# Patient Record
Sex: Female | Born: 1995 | Hispanic: No | Marital: Single | Smoking: Never smoker
Health system: Southern US, Community
[De-identification: ages and names within clinical notes are randomized; demographics above are authoritative.]

## PROBLEM LIST (undated history)

## (undated) HISTORY — PX: WISDOM TOOTH EXTRACTION: SHX21

---

## 2019-06-05 ENCOUNTER — Other Ambulatory Visit: Payer: Self-pay | Admitting: *Deleted

## 2019-06-05 DIAGNOSIS — Z20822 Contact with and (suspected) exposure to covid-19: Secondary | ICD-10-CM

## 2019-06-05 NOTE — Progress Notes (Signed)
[id number] 

## 2019-06-09 LAB — NOVEL CORONAVIRUS, NAA: SARS-CoV-2, NAA: NOT DETECTED

## 2019-09-28 ENCOUNTER — Other Ambulatory Visit: Payer: Self-pay

## 2019-09-28 ENCOUNTER — Encounter (HOSPITAL_COMMUNITY): Payer: Self-pay | Admitting: Emergency Medicine

## 2019-09-28 ENCOUNTER — Ambulatory Visit (HOSPITAL_COMMUNITY)
Admission: EM | Admit: 2019-09-28 | Discharge: 2019-09-28 | Disposition: A | Discharge status: Home / Self Care | Payer: BC Managed Care – PPO

## 2019-09-28 DIAGNOSIS — U071 COVID-19: Secondary | ICD-10-CM

## 2019-09-28 MED ORDER — ACETAMINOPHEN 325 MG PO TABS
650.0000 mg | ORAL_TABLET | Freq: Once | ORAL | Status: AC
  Administered 2019-09-28: 14:00:00 650 mg via ORAL

## 2019-09-28 MED ORDER — ACETAMINOPHEN 160 MG/5ML PO SOLN
ORAL | Status: AC
  Filled 2019-09-28: qty 20.3

## 2019-09-28 NOTE — ED Provider Notes (Signed)
[place]    CSN: [id number] Arrival date & time: 09/28/19  1302      History   Chief Complaint Chief Complaint  Patient presents with  . Appointment    1:10 pm  . Fever    HPI Ruth Martinez is a 23 y.o. female.   Patient presents with 2-day history of fever.  [facility] and diagnosed with COVID.  She had a chest x-ray done and was told she has pneumonia.  She was started on Augmentin, Mucinex, albuterol inhaler.  She reports noticing a smell of ammonia over the last day.  She also reports nonproductive cough and exertional shortness of breath at times.  She denies vomiting or diarrhea.  She has been treating her fever at home with Tylenol but has not taken any since early this morning.  The history is provided by the patient.    History reviewed. No pertinent past medical history.  There are no active problems to display for this patient.   History reviewed. No pertinent surgical history.  OB History   No obstetric history on file.      Home Medications    Prior to Admission medications   Medication Sig Start Date End Date Taking? Authorizing Provider  acetaminophen (TYLENOL) 160 MG/5ML elixir Take 15 mg/kg by mouth every 4 (four) hours as needed for fever.   Yes [provider]  ALBUTEROL IN Inhale into the lungs.   Yes [provider]  amoxicillin-clavulanate (AUGMENTIN) 875-125 MG tablet Take 1 tablet by mouth 2 (two) times daily.   Yes [provider]  guaiFENesin (MUCINEX) 600 MG 12 hr tablet Take by mouth 2 (two) times daily.   Yes [provider]  Multiple Vitamins-Minerals (ZINC PO) Take by mouth.   Yes [provider]    Family History Family History  Problem Relation Age of Onset  . Diabetes Mother     Social History Social History   Tobacco Use  . Smoking status: Never Smoker  Substance Use Topics  . Alcohol use: Yes  . Drug use: Never     Allergies    Patient has no known allergies.   Review of Systems Review of Systems  Constitutional: Positive for fever. Negative for chills.  HENT: Negative for ear pain and sore throat.   Eyes: Negative for pain and visual disturbance.  Respiratory: Positive for cough and shortness of breath.   Cardiovascular: Negative for chest pain and palpitations.  Gastrointestinal: Negative for abdominal pain, diarrhea, nausea and vomiting.  Genitourinary: Negative for dysuria and hematuria.  Musculoskeletal: Negative for arthralgias and back pain.  Skin: Negative for color change and rash.  Neurological: Negative for seizures and syncope.  All other systems reviewed and are negative.    Physical Exam Triage Vital Signs ED Triage Vitals  Enc Vitals Group     BP      Pulse      Resp      Temp      Temp src      SpO2      Weight      Height      Head Circumference      Peak Flow      Pain Score      Pain Loc      Pain Edu?      Excl. in [place]?    No data found.  Updated Vital Signs BP (!) 141/90 (BP Location: Left Arm)  Comment (BP Location): regular on forearm  Pulse (!) 118   Temp (!) 101.3 F (38.5 C) (Oral)   Resp (!) 24   LMP 09/12/2019   SpO2 98%   Visual Acuity Right Eye Distance:   Left Eye Distance:   Bilateral Distance:    Right Eye Near:   Left Eye Near:    Bilateral Near:     Physical Exam Vitals signs and nursing note reviewed.  Constitutional:      General: She is not in acute distress.    Appearance: She is well-developed. She is obese. She is not ill-appearing.  HENT:     Head: Normocephalic and atraumatic.     Right Ear: Tympanic membrane normal.     Left Ear: Tympanic membrane normal.     Nose: Nose normal.     Mouth/Throat:     Mouth: Mucous membranes are moist.     Pharynx: Oropharynx is clear.  Eyes:     Conjunctiva/sclera: Conjunctivae normal.  Neck:     Musculoskeletal: Neck supple.  Cardiovascular:     Rate and Rhythm: Normal rate and regular  rhythm.     Heart sounds: No murmur.  Pulmonary:     Effort: Pulmonary effort is normal. No respiratory distress.     Breath sounds: Normal breath sounds.  Abdominal:     General: Bowel sounds are normal.     Palpations: Abdomen is soft.     Tenderness: There is no abdominal tenderness. There is no guarding or rebound.  Skin:    General: Skin is warm and dry.     Findings: No rash.  Neurological:     General: No focal deficit present.     Mental Status: She is alert and oriented to person, place, and time.      UC Treatments / Results  Labs (all labs ordered are listed, but only abnormal results are displayed) Labs Reviewed - No data to display  EKG   Radiology No results found.  Procedures Procedures (including critical care time)  Medications Ordered in UC Medications  acetaminophen (TYLENOL) tablet 650 mg (650 mg Oral Given by Other 09/28/19 1408)  acetaminophen (TYLENOL) 160 MG/5ML solution (has no administration in time range)    Initial Impression / Assessment and Plan / UC Course  I have reviewed the triage vital signs and the nursing notes.  Pertinent labs & imaging results that were available during my care of the patient were reviewed by me and considered in my medical decision making (see chart for details).    COVID-19.  Instructed patient to take Tylenol as needed for fever and discomfort.  Instructed her to continue the albuterol, Augmentin, Mucinex.  Instructed her to go to the emergency department if she has acute worsening symptoms such as high fever, shortness of breath, severe vomiting or diarrhea, or other concerning symptoms.  Patient agrees to plan of care.     Final Clinical Impressions(s) / UC Diagnoses   Final diagnoses:  COVID-19     Discharge Instructions     Take Tylenol as needed for fever and discomfort.  Continue to use the albuterol inhaler.  Continue to take the antibiotic Augmentin and the Mucinex.    Go to the emergency  department if you have acute worsening symptoms, such as high fever, shortness of breath, severe vomiting or diarrhea.        ED Prescriptions    None     PDMP not reviewed this encounter.   Wendee Beaversate, Tavi Hoogendoorn  H, NP 09/28/19 1427

## 2019-09-28 NOTE — ED Triage Notes (Signed)
Patient has complaints of fever.  [facility].  Patient reports she was told she is covid positive and has pneumonia, started patient on mucinex, albuterol and amoxicillin.    Patient complains of a constant sense or detectable odor of "amonia" smell with any inhalation  Patient did 2 e-visits with insurance carrier and they are questioning why patient is on amoxicillin with covid and pneumonia.    Patient feels about the same, headache slightly better.

## 2019-09-28 NOTE — Discharge Instructions (Addendum)
Take Tylenol as needed for fever and discomfort.  Continue to use the albuterol inhaler.  Continue to take the antibiotic Augmentin and the Mucinex.    Go to the emergency department if you have acute worsening symptoms, such as high fever, shortness of breath, severe vomiting or diarrhea.

## 2019-10-04 ENCOUNTER — Emergency Department (HOSPITAL_COMMUNITY): Payer: BC Managed Care – PPO

## 2019-10-04 ENCOUNTER — Encounter (HOSPITAL_COMMUNITY): Payer: Self-pay

## 2019-10-04 ENCOUNTER — Other Ambulatory Visit: Payer: Self-pay

## 2019-10-04 ENCOUNTER — Inpatient Hospital Stay (HOSPITAL_COMMUNITY)
Admission: EM | Admit: 2019-10-04 | Discharge: 2019-10-09 | DRG: 871 | Disposition: A | Discharge status: Home / Self Care | Payer: BC Managed Care – PPO | Attending: Internal Medicine | Admitting: Internal Medicine

## 2019-10-04 DIAGNOSIS — Z6841 Body Mass Index (BMI) 40.0 and over, adult: Secondary | ICD-10-CM

## 2019-10-04 DIAGNOSIS — U071 COVID-19: Secondary | ICD-10-CM | POA: Diagnosis present

## 2019-10-04 DIAGNOSIS — A419 Sepsis, unspecified organism: Secondary | ICD-10-CM | POA: Diagnosis not present

## 2019-10-04 DIAGNOSIS — J1289 Other viral pneumonia: Secondary | ICD-10-CM | POA: Diagnosis present

## 2019-10-04 DIAGNOSIS — Z23 Encounter for immunization: Secondary | ICD-10-CM | POA: Diagnosis not present

## 2019-10-04 DIAGNOSIS — J9601 Acute respiratory failure with hypoxia: Secondary | ICD-10-CM | POA: Diagnosis present

## 2019-10-04 DIAGNOSIS — R0602 Shortness of breath: Secondary | ICD-10-CM

## 2019-10-04 DIAGNOSIS — J129 Viral pneumonia, unspecified: Secondary | ICD-10-CM | POA: Diagnosis not present

## 2019-10-04 DIAGNOSIS — K5289 Other specified noninfective gastroenteritis and colitis: Secondary | ICD-10-CM | POA: Diagnosis present

## 2019-10-04 DIAGNOSIS — R0902 Hypoxemia: Secondary | ICD-10-CM | POA: Diagnosis present

## 2019-10-04 DIAGNOSIS — A4189 Other specified sepsis: Principal | ICD-10-CM | POA: Diagnosis present

## 2019-10-04 LAB — C-REACTIVE PROTEIN: CRP: 16.1 mg/dL — ABNORMAL HIGH (ref <1.0)

## 2019-10-04 LAB — LACTIC ACID, PLASMA: Lactic Acid, Venous: 1.3 mmol/L (ref 0.5–1.9)

## 2019-10-04 LAB — CBC WITH DIFFERENTIAL/PLATELET
Abs Immature Granulocytes: 0.04 10*3/uL (ref 0.00–0.07)
Basophils Absolute: 0 10*3/uL (ref 0.0–0.1)
Basophils Relative: 0 %
Eosinophils Absolute: 0 10*3/uL (ref 0.0–0.5)
Eosinophils Relative: 0 %
HCT: 39.5 % (ref 36.0–46.0)
Hemoglobin: 12.4 g/dL (ref 12.0–15.0)
Immature Granulocytes: 1 %
Lymphocytes Relative: 20 %
Lymphs Abs: 1.4 10*3/uL (ref 0.7–4.0)
MCH: 29.7 pg (ref 26.0–34.0)
MCHC: 31.4 g/dL (ref 30.0–36.0)
MCV: 94.5 fL (ref 80.0–100.0)
Monocytes Absolute: 0.9 10*3/uL (ref 0.1–1.0)
Monocytes Relative: 13 %
Neutro Abs: 4.6 10*3/uL (ref 1.7–7.7)
Neutrophils Relative %: 66 %
Platelets: 391 10*3/uL (ref 150–400)
RBC: 4.18 MIL/uL (ref 3.87–5.11)
RDW: 12.9 % (ref 11.5–15.5)
WBC: 7.1 10*3/uL (ref 4.0–10.5)
nRBC: 0 % (ref 0.0–0.2)

## 2019-10-04 LAB — URINALYSIS, ROUTINE W REFLEX MICROSCOPIC
Bilirubin Urine: NEGATIVE
Glucose, UA: NEGATIVE mg/dL
Ketones, ur: NEGATIVE mg/dL
Nitrite: NEGATIVE
Protein, ur: 100 mg/dL — AB
RBC / HPF: >50 RBC/hpf — ABNORMAL HIGH (ref 0–5)
Specific Gravity, Urine: 1.027 (ref 1.005–1.030)
pH: 6 (ref 5.0–8.0)

## 2019-10-04 LAB — COMPREHENSIVE METABOLIC PANEL
ALT: 23 U/L (ref 0–44)
AST: 29 U/L (ref 15–41)
Albumin: 3 g/dL — ABNORMAL LOW (ref 3.5–5.0)
Alkaline Phosphatase: 38 U/L (ref 38–126)
Anion gap: 13 (ref 5–15)
BUN: 7 mg/dL (ref 6–20)
CO2: 24 mmol/L (ref 22–32)
Calcium: 8.3 mg/dL — ABNORMAL LOW (ref 8.9–10.3)
Chloride: 98 mmol/L (ref 98–111)
Creatinine, Ser: 0.77 mg/dL (ref 0.44–1.00)
GFR calc Af Amer: >60 mL/min (ref >60)
GFR calc non Af Amer: >60 mL/min (ref >60)
Glucose, Bld: 109 mg/dL — ABNORMAL HIGH (ref 70–99)
Potassium: 3.2 mmol/L — ABNORMAL LOW (ref 3.5–5.1)
Sodium: 135 mmol/L (ref 135–145)
Total Bilirubin: 0.5 mg/dL (ref 0.3–1.2)
Total Protein: 7.9 g/dL (ref 6.5–8.1)

## 2019-10-04 LAB — I-STAT BETA HCG BLOOD, ED (MC, WL, AP ONLY): I-stat hCG, quantitative: <5 m[IU]/mL (ref <5)

## 2019-10-04 LAB — PROCALCITONIN: Procalcitonin: <0.1 ng/mL

## 2019-10-04 LAB — LACTATE DEHYDROGENASE: LDH: 293 U/L — ABNORMAL HIGH (ref 98–192)

## 2019-10-04 LAB — FERRITIN: Ferritin: 268 ng/mL (ref 11–307)

## 2019-10-04 LAB — D-DIMER, QUANTITATIVE: D-Dimer, Quant: 0.45 ug/mL-FEU (ref 0.00–0.50)

## 2019-10-04 LAB — FIBRINOGEN: Fibrinogen: 615 mg/dL — ABNORMAL HIGH (ref 210–475)

## 2019-10-04 LAB — TRIGLYCERIDES: Triglycerides: 97 mg/dL (ref <150)

## 2019-10-04 MED ORDER — ACETAMINOPHEN 650 MG RE SUPP: 650.0000 mg | Freq: Four times a day (QID) | RECTAL | Status: DC | PRN

## 2019-10-04 MED ORDER — SODIUM CHLORIDE 0.9 % IV SOLN
500.0000 mg | INTRAVENOUS | Status: DC
  Administered 2019-10-04: 500 mg via INTRAVENOUS
  Filled 2019-10-04: qty 500

## 2019-10-04 MED ORDER — DEXAMETHASONE SODIUM PHOSPHATE 10 MG/ML IJ SOLN
6.0000 mg | INTRAMUSCULAR | Status: DC
  Administered 2019-10-05 – 2019-10-07 (×4): 6 mg via INTRAVENOUS
  Filled 2019-10-04 (×4): qty 1

## 2019-10-04 MED ORDER — ONDANSETRON HCL 4 MG/2ML IJ SOLN: 4.0000 mg | Freq: Four times a day (QID) | INTRAMUSCULAR | Status: DC | PRN

## 2019-10-04 MED ORDER — ALBUTEROL SULFATE HFA 108 (90 BASE) MCG/ACT IN AERS
4.0000 | INHALATION_SPRAY | Freq: Once | RESPIRATORY_TRACT | Status: AC
  Administered 2019-10-04: 4 via RESPIRATORY_TRACT
  Filled 2019-10-04: qty 6.7

## 2019-10-04 MED ORDER — SODIUM CHLORIDE 0.9 % IV SOLN
2.0000 g | INTRAVENOUS | Status: DC
  Administered 2019-10-04: 2 g via INTRAVENOUS
  Filled 2019-10-04: qty 20

## 2019-10-04 MED ORDER — ENOXAPARIN SODIUM 40 MG/0.4ML ~~LOC~~ SOLN
40.0000 mg | SUBCUTANEOUS | Status: DC
  Administered 2019-10-05: 40 mg via SUBCUTANEOUS
  Filled 2019-10-04: qty 0.4

## 2019-10-04 MED ORDER — ACETAMINOPHEN 325 MG PO TABS: 650.0000 mg | ORAL_TABLET | Freq: Four times a day (QID) | ORAL | Status: DC | PRN

## 2019-10-04 MED ORDER — ALBUTEROL SULFATE HFA 108 (90 BASE) MCG/ACT IN AERS
2.0000 | INHALATION_SPRAY | RESPIRATORY_TRACT | Status: DC | PRN
  Filled 2019-10-04: qty 6.7

## 2019-10-04 MED ORDER — ONDANSETRON HCL 4 MG PO TABS: 4.0000 mg | ORAL_TABLET | Freq: Four times a day (QID) | ORAL | Status: DC | PRN

## 2019-10-04 MED ORDER — LACTATED RINGERS IV SOLN
INTRAVENOUS | Status: DC
  Administered 2019-10-05 (×2): via INTRAVENOUS

## 2019-10-04 MED ORDER — SODIUM CHLORIDE 0.9% FLUSH: 3.0000 mL | Freq: Once | INTRAVENOUS | Status: DC

## 2019-10-04 MED ORDER — ACETAMINOPHEN 500 MG PO TABS
1000.0000 mg | ORAL_TABLET | Freq: Once | ORAL | Status: AC
  Administered 2019-10-04: 1000 mg via ORAL
  Filled 2019-10-04: qty 2

## 2019-10-04 NOTE — H&P (Signed)
History and Physical    Ruth Martinez HGD:[id number] DOB: 04-01-1996 DOA: 10/04/2019  PCP: System, Provider Not In  Patient coming from: Home.  Chief Complaint: Shortness of breath.  HPI: Ruth Martinez is a 23 y.o. female with no significant past medical history presents to the ER with complaint of worsening shortness of breath and diarrhea.  Patient states around 2 weeks ago had gone to [place] to admit waiting 2 days later she returned back to [place] and started having headache and fever chills.  On November 5 she went to urgent care and was diagnosed with Covid 19.  Patient was advised to be in quarantine and was symptomatically treated.  Following which patient has gradually become more short of breath with worsening nonproductive cough and worsening diarrhea.  Denies vomiting.  Feeling fatigued.  Given the symptoms patient presents to the ER.  ED Course: In the ER patient is mildly hypoxic with chest x-ray showing bilateral infiltrates tachycardic lab work show creatinine 1.7 LDH 293 CRP 16.1 procalcitonin less than 0.1 lactic acid 1.3 WBC count 7.1 hemoglobin 12.4 platelets 391 EKG shows sinus tachycardia UA shows WBC 21-50 more than 50 RBCs COVID-19 test OU is pending and we do not have a documented COVID-19 test yet.  Given the hypoxia and shortness of breath I started patient on Decadron admitted for further management of acute respiratory failure likely from Covid pneumonia but we need to have confirmed test.  Patient is also empirically started on antibiotics for community-acquired pneumonia.  Review of Systems: As per HPI, rest all negative.   History reviewed. No pertinent past medical history.  Past Surgical History:  Procedure Laterality Date  . WISDOM TOOTH EXTRACTION       reports that she has never smoked. She has never used smokeless tobacco. She reports current alcohol use. She reports that she does not use drugs.  No Known Allergies  Family History   Problem Relation Age of Onset  . Diabetes Mother     Prior to Admission medications   Medication Sig Start Date End Date Taking? Authorizing Provider  albuterol (VENTOLIN HFA) 108 (90 Base) MCG/ACT inhaler Inhale 2 puffs into the lungs every 4 (four) hours as needed for wheezing or shortness of breath.  09/26/19  Yes [provider]  guaiFENesin (MUCINEX) 600 MG 12 hr tablet Take 600 mg by mouth 2 (two) times daily.    Yes [provider]    Physical Exam: Constitutional: Moderately built and nourished. Vitals:   10/04/19 2102  BP: (!) 178/76  Pulse: (!) 117  Resp: (!) 22  Temp: (!) 101.3 F (38.5 C)  TempSrc: Oral  SpO2: 93%   Eyes: Anicteric no pallor. ENMT: No discharge from the ears eyes nose or mouth. Neck: No mass felt.  No neck rigidity. Respiratory: No rhonchi or crepitations. Cardiovascular: S1-S2 heard. Abdomen: Soft nontender bowel sounds present. Musculoskeletal: No edema. Skin: No rash. Neurologic: Alert awake oriented to time place and person.  Moves all extremities. Psychiatric: Appears normal per normal affect.   Labs on Admission: I have personally reviewed following labs and imaging studies  CBC: Recent Labs  Lab 10/04/19 2125  WBC 7.1  NEUTROABS 4.6  HGB 12.4  HCT 39.5  MCV 94.5  PLT 391   Basic Metabolic Panel: Recent Labs  Lab 10/04/19 2125  NA 135  K 3.2*  CL 98  CO2 24  GLUCOSE 109*  BUN 7  CREATININE 0.77  CALCIUM 8.3*   GFR: CrCl cannot be  calculated (Unknown ideal weight.). Liver Function Tests: Recent Labs  Lab 10/04/19 2125  AST 29  ALT 23  ALKPHOS 38  BILITOT 0.5  PROT 7.9  ALBUMIN 3.0*   No results for input(s): LIPASE, AMYLASE in the last 168 hours. No results for input(s): AMMONIA in the last 168 hours. Coagulation Profile: No results for input(s): INR, PROTIME in the last 168 hours. Cardiac Enzymes: No results for input(s): CKTOTAL, CKMB, CKMBINDEX, TROPONINI in the last 168 hours. BNP  (last 3 results) No results for input(s): PROBNP in the last 8760 hours. HbA1C: No results for input(s): HGBA1C in the last 72 hours. CBG: No results for input(s): GLUCAP in the last 168 hours. Lipid Profile: Recent Labs    10/04/19 2158  TRIG 97   Thyroid Function Tests: No results for input(s): TSH, T4TOTAL, FREET4, T3FREE, THYROIDAB in the last 72 hours. Anemia Panel: Recent Labs    10/04/19 2158  FERRITIN 268   Urine analysis:    Component Value Date/Time   COLORURINE YELLOW 10/04/2019 2223   APPEARANCEUR HAZY (A) 10/04/2019 2223   LABSPEC 1.027 10/04/2019 2223   PHURINE 6.0 10/04/2019 2223   GLUCOSEU NEGATIVE 10/04/2019 2223   HGBUR LARGE (A) 10/04/2019 2223   BILIRUBINUR NEGATIVE 10/04/2019 2223   KETONESUR NEGATIVE 10/04/2019 2223   PROTEINUR 100 (A) 10/04/2019 2223   NITRITE NEGATIVE 10/04/2019 2223   LEUKOCYTESUR SMALL (A) 10/04/2019 2223   Sepsis Labs: @LABRCNTIP (procalcitonin:4,lacticidven:4) )No results found for this or any previous visit (from the past 240 hour(s)).   Radiological Exams on Admission: Dg Chest Portable 1 View  Result Date: 10/04/2019 CLINICAL DATA:  COVID, shortness of breath EXAM: PORTABLE CHEST 1 VIEW COMPARISON:  None. FINDINGS: Heart is normal size. Bilateral hazy airspace opacities in the mid and lower lungs, left greater than right. Low lung volumes. No effusions. No acute bony abnormality. IMPRESSION: Patchy bilateral airspace opacities, left greater than right. Low lung volumes. Electronically Signed   By: Rolm Baptise M.D.   On: 10/04/2019 22:26    EKG: Independently reviewed.  Sinus tachycardia.  Assessment/Plan Principal Problem:   Acute respiratory failure with hypoxemia (HCC) Active Problems:   Viral pneumonia   Acute respiratory failure with hypoxia (HCC)    1. Acute respiratory failure with hypoxia and SIRS picture likely from Covid pneumonia.  COVID-19 test is pending.  Patient states she was confirmed COVID-19 on  September 26, 2019 results of which we do not have access to.  For now I have kept patient on Decadron 6 mg IV daily and antibiotics for community-acquired pneumonia if COVID-19 is positive can discontinue antibiotics.  If COVID-19 does come back positive we will need to start patient on remdesivir. 2. Readings of elevated blood pressure -patient states she does not have hypertension.  Closely follow blood pressure trends. 3. Diarrhea likely from COVID-19 infection.  Given the patient has SIRS and respiratory failure symptoms will need more than 2 midnight stay in inpatient status.   DVT prophylaxis: Lovenox. Code Status: Full code. Family Communication: Discussed with patient. Disposition Plan: Home. Consults called: None. Admission status: Inpatient.   Rise Patience MD [facility] Pager [phone].  If 7PM-7AM, please contact night-coverage [email or web address] Password [email or web address]  10/04/2019, 11:50 PM

## 2019-10-04 NOTE — ED Provider Notes (Signed)
Emergency Department Provider Note   I have reviewed the triage vital signs and the nursing notes.   HISTORY  Chief Complaint COVID   HPI Ruth Martinez is a 23 y.o. female presents emergency department with worsening shortness of breath and fever in the setting of testing positive for COVID-19 on 11/5.  Patient states initially she began having respiratory symptoms after returning home from a wedding in [place].  She was started on Augmentin and her COVID-19 test came back positive.  She has been managing with supportive care at home but over the past several days become increasingly short of breath.  She states she can now only walk approximately 10 steps before she has to stop and rest which is very unusual for her.  She is only having occasional chest pain with coughing.  Denies vomiting. Patient does describe some diarrhea. No abdominal pain.  Denies UTI symptoms.  She has had difficulty controlling her fever with Tylenol alone.  No history of asthma or other breathing problems.   History reviewed. No pertinent past medical history.  Patient Active Problem List   Diagnosis Date Noted  . Pneumonia due to COVID-19 virus 10/05/2019  . Sepsis ([place]) 10/05/2019  . Acute respiratory failure with hypoxemia ([place]) 10/04/2019  . Viral pneumonia 10/04/2019  . Acute respiratory failure with hypoxia ([place]) 10/04/2019    Past Surgical History:  Procedure Laterality Date  . WISDOM TOOTH EXTRACTION      Allergies Patient has no known allergies.  Family History  Problem Relation Age of Onset  . Diabetes Mother     Social History Social History   Tobacco Use  . Smoking status: Never Smoker  . Smokeless tobacco: Never Used  Substance Use Topics  . Alcohol use: Yes    Comment: Occasionally  . Drug use: Never    Review of Systems  Constitutional: Positive fever/chills Eyes: No visual changes. ENT: Positive congestion.  Cardiovascular: Denies chest pain. Respiratory:  Positive shortness of breath. Gastrointestinal: No abdominal pain. No nausea, no vomiting. No diarrhea.  No constipation. Genitourinary: Negative for dysuria. Musculoskeletal: Negative for back pain. Skin: Negative for rash. Neurological: Negative for headaches, focal weakness or numbness.  10-point ROS otherwise negative.  ____________________________________________   PHYSICAL EXAM:  VITAL SIGNS: ED Triage Vitals  Enc Vitals Group     BP 10/04/19 2102 (!) 178/76     Pulse Rate 10/04/19 2102 (!) 117     Resp 10/04/19 2102 (!) 22     Temp 10/04/19 2102 (!) 101.3 F (38.5 C)     Temp Source 10/04/19 2102 Oral     SpO2 10/04/19 2102 93 %     Pain Score 10/04/19 2108 10   Constitutional: Alert and oriented. Well appearing and in no acute distress. Eyes: Conjunctivae are normal. Head: Atraumatic. Nose: No congestion/rhinnorhea. Mouth/Throat: Mucous membranes are moist.   Neck: No stridor.   Cardiovascular: Tachycardia. Good peripheral circulation. Grossly normal heart sounds.   Respiratory: Increased respiratory effort.  No retractions. Lungs CTAB. Gastrointestinal: Soft and nontender. No distention.  Musculoskeletal: No lower extremity tenderness nor edema. No gross deformities of extremities. Neurologic:  Normal speech and language. No gross focal neurologic deficits are appreciated.  Skin:  Skin is warm, dry and intact. No rash noted.   ____________________________________________   LABS (all labs ordered are listed, but only abnormal results are displayed)  Labs Reviewed  SARS CORONAVIRUS 2 (TAT 6-24 HRS) - Abnormal; Notable for the following components:      Result  Value   SARS Coronavirus 2 POSITIVE (*)    All other components within normal limits  COMPREHENSIVE METABOLIC PANEL - Abnormal; Notable for the following components:   Potassium 3.2 (*)    Glucose, Bld 109 (*)    Calcium 8.3 (*)    Albumin 3.0 (*)    All other components within normal limits   URINALYSIS, ROUTINE W REFLEX MICROSCOPIC - Abnormal; Notable for the following components:   APPearance HAZY (*)    Hgb urine dipstick LARGE (*)    Protein, ur 100 (*)    Leukocytes,Ua SMALL (*)    RBC / HPF >50 (*)    Bacteria, UA RARE (*)    All other components within normal limits  LACTATE DEHYDROGENASE - Abnormal; Notable for the following components:   LDH 293 (*)    All other components within normal limits  FIBRINOGEN - Abnormal; Notable for the following components:   Fibrinogen 615 (*)    All other components within normal limits  C-REACTIVE PROTEIN - Abnormal; Notable for the following components:   CRP 16.1 (*)    All other components within normal limits  BASIC METABOLIC PANEL - Abnormal; Notable for the following components:   Glucose, Bld 165 (*)    BUN <5 (*)    Calcium 8.3 (*)    All other components within normal limits  CBC - Abnormal; Notable for the following components:   Hemoglobin 11.7 (*)    All other components within normal limits  CULTURE, BLOOD (ROUTINE X 2)  CULTURE, BLOOD (ROUTINE X 2)  LACTIC ACID, PLASMA  CBC WITH DIFFERENTIAL/PLATELET  D-DIMER, QUANTITATIVE (NOT [facility])  PROCALCITONIN  FERRITIN  TRIGLYCERIDES  CBC  CREATININE, SERUM  HIV ANTIBODY (ROUTINE TESTING W REFLEX)  I-STAT BETA HCG BLOOD, ED ([facility], [facility], [facility] ONLY)   ____________________________________________  EKG   EKG Interpretation  Date/Time:  Friday October 04 2019 21:14:37 EST Ventricular Rate:  113 PR Interval:  146 QRS Duration: 86 QT Interval:  326 QTC Calculation: 447 R Axis:   98 Text Interpretation: Sinus tachycardia Rightward axis Borderline ECG No STEMI Confirmed by Alona BeneLong, Joshua [phone]([id number]) on 10/04/2019 10:49:48 PM       ____________________________________________  RADIOLOGY  Dg Chest Portable 1 View  Result Date: 10/04/2019 CLINICAL DATA:  COVID, shortness of breath EXAM: PORTABLE CHEST 1 VIEW COMPARISON:  None. FINDINGS: Heart is normal size.  Bilateral hazy airspace opacities in the mid and lower lungs, left greater than right. Low lung volumes. No effusions. No acute bony abnormality. IMPRESSION: Patchy bilateral airspace opacities, left greater than right. Low lung volumes. Electronically Signed   By: Charlett NoseKevin  Dover M.D.   On: 10/04/2019 22:26    ____________________________________________   PROCEDURES  Procedure(s) performed:   Procedures  CRITICAL CARE Performed by: Maia PlanJoshua G Long Total critical care time: 35 minutes Critical care time was exclusive of separately billable procedures and treating other patients. Critical care was necessary to treat or prevent imminent or life-threatening deterioration. Critical care was time spent personally by me on the following activities: development of treatment plan with patient and/or surrogate as well as nursing, discussions with consultants, evaluation of patient's response to treatment, examination of patient, obtaining history from patient or surrogate, ordering and performing treatments and interventions, ordering and review of laboratory studies, ordering and review of radiographic studies, pulse oximetry and re-evaluation of patient's condition.  Alona BeneJoshua Long, MD Emergency Medicine  ____________________________________________   INITIAL IMPRESSION / ASSESSMENT AND PLAN / ED COURSE  Pertinent  labs & imaging results that were available during my care of the patient were reviewed by me and considered in my medical decision making (see chart for details).   Patient presents to the emergency department for evaluation of worsening shortness of breath and continued fever in the setting of COVID-19 diagnosis.  I do not have a positive COVID-19 test in our system.  Patient is more dyspneic than I would expect given her age and other risk factors.  She has borderline hypoxemia here with room air saturation 93% on room air in triage.  I will ambulate the patient on pulse ox for further  evaluation.  Chest x-ray and labs are pending.  I have added preadmit Covid labs as well.   CXR with atypical PNA. Labs concerning for COVID but test is pending. Covering with abx initially. Patient ambulated and became very SOB and sats dipped to 90%. Placed on 3L [place] and feeling improved.   Discussed patient's case with [facility], Dr. Toniann Fail to request admission. Patient and family (if present) updated with plan. Care transferred to [facility] service.  I reviewed all nursing notes, vitals, pertinent old records, EKGs, labs, imaging (as available).  ____________________________________________  FINAL CLINICAL IMPRESSION(S) / ED DIAGNOSES  Final diagnoses:  COVID-19  Hypoxemia  SOB (shortness of breath)     MEDICATIONS GIVEN DURING THIS VISIT:  Medications  sodium chloride flush (NS) 0.9 % injection 3 mL (3 mLs Intravenous Not Given 10/04/19 2348)  cefTRIAXone (ROCEPHIN) 2 g in sodium chloride 0.9 % 100 mL IVPB (0 g Intravenous Stopped 10/04/19 2354)  azithromycin (ZITHROMAX) 500 mg in sodium chloride 0.9 % 250 mL IVPB (0 mg Intravenous Stopped 10/05/19 0129)  albuterol (VENTOLIN HFA) 108 (90 Base) MCG/ACT inhaler 2 puff (has no administration in time range)  acetaminophen (TYLENOL) tablet 650 mg (has no administration in time range)    Or  acetaminophen (TYLENOL) suppository 650 mg (has no administration in time range)  ondansetron (ZOFRAN) tablet 4 mg (has no administration in time range)    Or  ondansetron (ZOFRAN) injection 4 mg (has no administration in time range)  enoxaparin (LOVENOX) injection 40 mg (40 mg Subcutaneous Given 10/05/19 1129)  lactated ringers infusion ( Intravenous Transfusing/Transfer 10/05/19 1145)  dexamethasone (DECADRON) injection 6 mg (6 mg Intravenous Given 10/05/19 0008)  remdesivir 100 mg in sodium chloride 0.9 % 250 mL IVPB (has no administration in time range)  remdesivir 200 mg in sodium chloride 0.9 % 250 mL IVPB (200 mg Intravenous Transfusing/Transfer  10/05/19 1145)  acetaminophen (TYLENOL) tablet 1,000 mg (1,000 mg Oral Given 10/04/19 2300)  albuterol (VENTOLIN HFA) 108 (90 Base) MCG/ACT inhaler 4 puff (4 puffs Inhalation Given 10/04/19 2300)    Note:  This document was prepared using Dragon voice recognition software and may include unintentional dictation errors.  Alona Bene, MD, [facility] Emergency Medicine    Long, Arlyss Repress, MD 10/05/19 1150

## 2019-10-04 NOTE — ED Triage Notes (Signed)
Pt states she has been COVID + for the past 9 days, seen at UC twice, SOB is getting worse and reports oxygen is dropping at home, diarrhea and fevers not controlled at home

## 2019-10-04 NOTE — ED Notes (Addendum)
Pt became severely winded walking from stretcher to door. Her O2 dropped from 97% @ baseline to 90% while ambulating. Pt with auditory wheezing and coughing upon exertion. She stated she was unable to walk more than twice from door to stretcher. Pt put on 2L nasal canula and seated in stretcher.

## 2019-10-05 ENCOUNTER — Other Ambulatory Visit: Payer: Self-pay

## 2019-10-05 DIAGNOSIS — J1289 Other viral pneumonia: Secondary | ICD-10-CM

## 2019-10-05 DIAGNOSIS — J1282 Pneumonia due to coronavirus disease 2019: Secondary | ICD-10-CM | POA: Diagnosis present

## 2019-10-05 DIAGNOSIS — A419 Sepsis, unspecified organism: Secondary | ICD-10-CM | POA: Diagnosis present

## 2019-10-05 DIAGNOSIS — U071 COVID-19: Secondary | ICD-10-CM

## 2019-10-05 LAB — CBC
HCT: 38.7 % (ref 36.0–46.0)
HCT: 36.1 % (ref 36.0–46.0)
Hemoglobin: 12.4 g/dL (ref 12.0–15.0)
Hemoglobin: 11.7 g/dL — ABNORMAL LOW (ref 12.0–15.0)
MCH: 29.8 pg (ref 26.0–34.0)
MCH: 29.9 pg (ref 26.0–34.0)
MCHC: 32 g/dL (ref 30.0–36.0)
MCHC: 32.4 g/dL (ref 30.0–36.0)
MCV: 93 fL (ref 80.0–100.0)
MCV: 92.3 fL (ref 80.0–100.0)
Platelets: 357 10*3/uL (ref 150–400)
Platelets: 362 10*3/uL (ref 150–400)
RBC: 4.16 MIL/uL (ref 3.87–5.11)
RBC: 3.91 MIL/uL (ref 3.87–5.11)
RDW: 12.8 % (ref 11.5–15.5)
RDW: 12.9 % (ref 11.5–15.5)
WBC: 7.4 10*3/uL (ref 4.0–10.5)
WBC: 6.9 10*3/uL (ref 4.0–10.5)
nRBC: 0 % (ref 0.0–0.2)
nRBC: 0 % (ref 0.0–0.2)

## 2019-10-05 LAB — CREATININE, SERUM
Creatinine, Ser: 0.7 mg/dL (ref 0.44–1.00)
GFR calc Af Amer: >60 mL/min (ref >60)
GFR calc non Af Amer: >60 mL/min (ref >60)

## 2019-10-05 LAB — HIV ANTIBODY (ROUTINE TESTING W REFLEX): HIV Screen 4th Generation wRfx: NONREACTIVE

## 2019-10-05 LAB — BASIC METABOLIC PANEL
Anion gap: 12 (ref 5–15)
BUN: <5 mg/dL — ABNORMAL LOW (ref 6–20)
CO2: 23 mmol/L (ref 22–32)
Calcium: 8.3 mg/dL — ABNORMAL LOW (ref 8.9–10.3)
Chloride: 102 mmol/L (ref 98–111)
Creatinine, Ser: 0.73 mg/dL (ref 0.44–1.00)
GFR calc Af Amer: >60 mL/min (ref >60)
GFR calc non Af Amer: >60 mL/min (ref >60)
Glucose, Bld: 165 mg/dL — ABNORMAL HIGH (ref 70–99)
Potassium: 3.5 mmol/L (ref 3.5–5.1)
Sodium: 137 mmol/L (ref 135–145)

## 2019-10-05 LAB — TYPE AND SCREEN
ABO/RH(D): B POS
Antibody Screen: NEGATIVE

## 2019-10-05 LAB — SARS CORONAVIRUS 2 (TAT 6-24 HRS): SARS Coronavirus 2: POSITIVE — AB

## 2019-10-05 MED ORDER — SODIUM CHLORIDE 0.9 % IV SOLN
200.0000 mg | Freq: Once | INTRAVENOUS | Status: AC
  Administered 2019-10-05: 200 mg via INTRAVENOUS
  Filled 2019-10-05: qty 40

## 2019-10-05 MED ORDER — LACTATED RINGERS IV SOLN: INTRAVENOUS | Status: DC

## 2019-10-05 MED ORDER — ORAL CARE MOUTH RINSE
15.0000 mL | Freq: Two times a day (BID) | OROMUCOSAL | Status: DC
  Administered 2019-10-05 – 2019-10-09 (×6): 15 mL via OROMUCOSAL

## 2019-10-05 MED ORDER — SODIUM CHLORIDE 0.9 % IV SOLN
100.0000 mg | INTRAVENOUS | Status: AC
  Administered 2019-10-06 – 2019-10-09 (×4): 100 mg via INTRAVENOUS
  Filled 2019-10-05 (×4): qty 100

## 2019-10-05 NOTE — ED Notes (Signed)
Breakfast ordered for patient  

## 2019-10-05 NOTE — ED Notes (Signed)
ED TO INPATIENT HANDOFF REPORT  ED Nurse Name and Phone #: Gracelynn Bircher, [phone]  S Name/Age/Gender Ruth Martinez 23 y.o. female Room/Bed: 016C/016C  Code Status   Code Status: Full Code  Home/SNF/Other Home Patient oriented to: self, place, time and situation Is this baseline? Yes   Triage Complete: Triage complete  Chief Complaint SOB, COVID+  Triage Note Pt states she has been COVID + for the past 9 days, seen at UC twice, SOB is getting worse and reports oxygen is dropping at home, diarrhea and fevers not controlled at home    Allergies No Known Allergies  Level of Care/Admitting Diagnosis ED Disposition    ED Disposition Condition Comment   Admit  Hospital Area: [facility] [[id number]]  Level of Care: Telemetry Medical [104]  Covid Evaluation: Person Under Investigation (PUI)  Diagnosis: Acute respiratory failure with hypoxia [facility](HCC) [[id number]]) [[id number]]  Admitting Physician: Eduard ClosKAKRAKANDY, ARSHAD N [phone]]  Attending Physician: Eduard ClosKAKRAKANDY, ARSHAD N [phone]]  Estimated length of stay: past midnight tomorrow  Certification:: I certify this patient will need inpatient services for at least 2 midnights  PT Class (Do Not Modify): Inpatient [101]  PT Acc Code (Do Not Modify): Private [1]       B Medical/Surgery History History reviewed. No pertinent past medical history. Past Surgical History:  Procedure Laterality Date  . WISDOM TOOTH EXTRACTION       A IV Location/Drains/Wounds Patient Lines/Drains/Airways Status   Active Line/Drains/Airways    Name:   Placement date:   Placement time:   Site:   Days:   Peripheral IV 10/04/19 Right Forearm   10/04/19    2317    Forearm   1   Peripheral IV 10/04/19 Right Hand   10/04/19    2321    Hand   1          Intake/Output Last 24 hours  Intake/Output Summary (Last 24 hours) at 10/05/2019 0210 Last data filed at 10/05/2019 0129 Gross per 24 hour  Intake 350 ml  Output -  Net 350 ml    Labs/Imaging Results for  orders placed or performed during the hospital encounter of 10/04/19 (from the past 48 hour(s))  Lactic acid, plasma     Status: None   Collection Time: 10/04/19  9:25 PM  Result Value Ref Range   Lactic Acid, Venous 1.3 0.5 - 1.9 mmol/L    Comment: Performed at [facility], [address] [place], [place] [address]  Comprehensive metabolic panel     Status: Abnormal   Collection Time: 10/04/19  9:25 PM  Result Value Ref Range   Sodium 135 135 - 145 mmol/L   Potassium 3.2 (L) 3.5 - 5.1 mmol/L   Chloride 98 98 - 111 mmol/L   CO2 24 22 - 32 mmol/L   Glucose, Bld 109 (H) 70 - 99 mg/dL   BUN 7 6 - 20 mg/dL   Creatinine, Ser 6.440.77 0.44 - 1.00 mg/dL   Calcium 8.3 (L) 8.9 - 10.3 mg/dL   Total Protein 7.9 6.5 - 8.1 g/dL   Albumin 3.0 (L) 3.5 - 5.0 g/dL   AST 29 15 - 41 U/L   ALT 23 0 - 44 U/L   Alkaline Phosphatase 38 38 - 126 U/L   Total Bilirubin 0.5 0.3 - 1.2 mg/dL   GFR calc non Af Amer >60 >60 mL/min   GFR calc Af Amer >60 >60 mL/min   Anion gap 13 5 - 15    Comment: Performed  at [facility], [address] [place], [place] [address]  CBC with Differential     Status: None   Collection Time: 10/04/19  9:25 PM  Result Value Ref Range   WBC 7.1 4.0 - 10.5 K/uL   RBC 4.18 3.87 - 5.11 MIL/uL   Hemoglobin 12.4 12.0 - 15.0 g/dL   HCT 98.1 19.1 - 47.8 %   MCV 94.5 80.0 - 100.0 fL   MCH 29.7 26.0 - 34.0 pg   MCHC 31.4 30.0 - 36.0 g/dL   RDW 29.5 62.1 - 30.8 %   Platelets 391 150 - 400 K/uL   nRBC 0.0 0.0 - 0.2 %   Neutrophils Relative % 66 %   Neutro Abs 4.6 1.7 - 7.7 K/uL   Lymphocytes Relative 20 %   Lymphs Abs 1.4 0.7 - 4.0 K/uL   Monocytes Relative 13 %   Monocytes Absolute 0.9 0.1 - 1.0 K/uL   Eosinophils Relative 0 %   Eosinophils Absolute 0.0 0.0 - 0.5 K/uL   Basophils Relative 0 %   Basophils Absolute 0.0 0.0 - 0.1 K/uL   Immature Granulocytes 1 %   Abs Immature Granulocytes 0.04 0.00 - 0.07 K/uL    Comment: Performed at [facility], [address], [place], [place] [address]  D-dimer, quantitative     Status: None   Collection Time: 10/04/19  9:58 PM  Result Value Ref Range   D-Dimer, Quant 0.45 0.00 - 0.50 ug/mL-FEU    Comment: (NOTE) At the manufacturer cut-off of 0.50 ug/mL FEU, this assay has been documented to exclude PE with a sensitivity and negative predictive value of 97 to 99%.  At this time, this assay has not been approved by the FDA to exclude DVT/VTE. Results should be correlated with clinical presentation. Performed at [facility], [address], [place], [place] [address]   Procalcitonin     Status: None   Collection Time: 10/04/19  9:58 PM  Result Value Ref Range   Procalcitonin <0.10 ng/mL    Comment:        Interpretation: PCT (Procalcitonin) <= 0.5 ng/mL: Systemic infection (sepsis) is not likely. Local bacterial infection is possible. (NOTE)       Sepsis PCT Algorithm           Lower Respiratory Tract                                      Infection PCT Algorithm    ----------------------------     ----------------------------         PCT < 0.25 ng/mL                PCT < 0.10 ng/mL         Strongly encourage             Strongly discourage   discontinuation of antibiotics    initiation of antibiotics    ----------------------------     -----------------------------       PCT 0.25 - 0.50 ng/mL            PCT 0.10 - 0.25 ng/mL               OR       >80% decrease in PCT            Discourage initiation of  antibiotics      Encourage discontinuation           of antibiotics    ----------------------------     -----------------------------         PCT >= 0.50 ng/mL              PCT 0.26 - 0.50 ng/mL               AND        <80% decrease in PCT             Encourage initiation of                                             antibiotics       Encourage continuation           of antibiotics    ----------------------------      -----------------------------        PCT >= 0.50 ng/mL                  PCT > 0.50 ng/mL               AND         increase in PCT                  Strongly encourage                                      initiation of antibiotics    Strongly encourage escalation           of antibiotics                                     -----------------------------                                           PCT <= 0.25 ng/mL                                                 OR                                        > 80% decrease in PCT                                     Discontinue / Do not initiate                                             antibiotics Performed at [facility], [place] [address], [place], [place] [address]   Lactate dehydrogenase     Status: Abnormal   Collection Time: 10/04/19  9:58 PM  Result Value Ref Range   LDH 293 (H) 98 - 192 U/L    Comment: Performed at [facility], [address] [place], [place] [address]  Ferritin     Status: None   Collection Time: 10/04/19  9:58 PM  Result Value Ref Range   Ferritin 268 11 - 307 ng/mL    Comment: Performed at [facility], [address] [place], [place] [address]  Triglycerides     Status: None   Collection Time: 10/04/19  9:58 PM  Result Value Ref Range   Triglycerides 97 <150 mg/dL    Comment: Performed at [facility], [address] [place], [place] [address]  Fibrinogen     Status: Abnormal   Collection Time: 10/04/19  9:58 PM  Result Value Ref Range   Fibrinogen 615 (H) 210 - 475 mg/dL    Comment: Performed at [facility], [address] [place], [place] [address]  C-reactive protein     Status: Abnormal   Collection Time: 10/04/19  9:58 PM  Result Value Ref Range   CRP 16.1 (H) <1.0 mg/dL    Comment: Performed at [facility], [address] [place], [place] [address]  I-Stat beta hCG blood, ED     Status: None   Collection Time: 10/04/19 10:01 PM  Result Value Ref Range    I-stat hCG, quantitative <5.0 <5 mIU/mL   Comment 3            Comment:   GEST. AGE      CONC.  (mIU/mL)   <=1 WEEK        5 - 50     2 WEEKS       50 - 500     3 WEEKS       100 - 10,000     4 WEEKS     1,000 - 30,000        FEMALE AND NON-PREGNANT FEMALE:     LESS THAN 5 mIU/mL   Urinalysis, Routine w reflex microscopic     Status: Abnormal   Collection Time: 10/04/19 10:23 PM  Result Value Ref Range   Color, Urine YELLOW YELLOW   APPearance HAZY (A) CLEAR   Specific Gravity, Urine 1.027 1.005 - 1.030   pH 6.0 5.0 - 8.0   Glucose, UA NEGATIVE NEGATIVE mg/dL   Hgb urine dipstick LARGE (A) NEGATIVE   Bilirubin Urine NEGATIVE NEGATIVE   Ketones, ur NEGATIVE NEGATIVE mg/dL   Protein, ur 130 (A) NEGATIVE mg/dL   Nitrite NEGATIVE NEGATIVE   Leukocytes,Ua SMALL (A) NEGATIVE   RBC / HPF >50 (H) 0 - 5 RBC/hpf   WBC, UA 21-50 0 - 5 WBC/hpf   Bacteria, UA RARE (A) NONE SEEN   Squamous Epithelial / LPF 0-5 0 - 5   Mucus PRESENT     Comment: Performed at [facility], [address] [place], [place] [address]  CBC     Status: None   Collection Time: 10/04/19 11:49 PM  Result Value Ref Range   WBC 7.4 4.0 - 10.5 K/uL   RBC 4.16 3.87 - 5.11 MIL/uL   Hemoglobin 12.4 12.0 - 15.0 g/dL   HCT 46.9 62.9 - 52.8 %   MCV 93.0 80.0 - 100.0 fL   MCH 29.8 26.0 - 34.0 pg   MCHC 32.0 30.0 - 36.0 g/dL   RDW 41.3 24.4 - 01.0 %   Platelets  357 150 - 400 K/uL   nRBC 0.0 0.0 - 0.2 %    Comment: Performed at [facility], [address] [place], [place] [address]   Dg Chest Portable 1 View  Result Date: 10/04/2019 CLINICAL DATA:  COVID, shortness of breath EXAM: PORTABLE CHEST 1 VIEW COMPARISON:  None. FINDINGS: Heart is normal size. Bilateral hazy airspace opacities in the mid and lower lungs, left greater than right. Low lung volumes. No effusions. No acute bony abnormality. IMPRESSION: Patchy bilateral airspace opacities, left greater than right. Low lung volumes. Electronically Signed    By: Charlett Nose M.D.   On: 10/04/2019 22:26    Pending Labs Unresulted Labs (From admission, onward)    Start     Ordered   10/11/19 0500  Creatinine, serum  (enoxaparin (LOVENOX)    CrCl >/= 30 ml/min)  Weekly,   R    Comments: while on enoxaparin therapy    10/04/19 2350   10/05/19 0500  Basic metabolic panel  Tomorrow morning,   R     10/04/19 2350   10/05/19 0500  CBC  Tomorrow morning,   R     10/04/19 2350   10/04/19 2349  Creatinine, serum  (enoxaparin (LOVENOX)    CrCl >/= 30 ml/min)  Once,   STAT    Comments: Baseline for enoxaparin therapy IF NOT ALREADY DRAWN.    10/04/19 2350   10/04/19 2159  SARS CORONAVIRUS 2 (TAT 6-24 HRS) Nasopharyngeal Nasopharyngeal Swab  (Novel Coronavirus, [facility] Order))  ONCE - STAT,   STAT    Question Answer Comment  Is this test for diagnosis or screening Diagnosis of ill patient   Symptomatic for COVID-19 as defined by CDC Yes   Date of Symptom Onset 09/26/2019   Hospitalized for COVID-19 Yes   Admitted to ICU for COVID-19 No   Previously tested for COVID-19 Yes   Resident in a congregate (group) care setting No   Employed in healthcare setting No   Pregnant No      10/04/19 2158   10/04/19 2158  Blood Culture (routine x 2)  BLOOD CULTURE X 2,   STAT     10/04/19 2158   10/04/19 2111  Lactic acid, plasma  Now then every 2 hours,   STAT     10/04/19 2111   10/04/19 0147  HIV Antibody (routine testing w rflx)  Once,   R     10/04/19 0147          Vitals/Pain Today's Vitals   10/04/19 2108 10/04/19 2215 10/04/19 2230 10/05/19 0200  BP:  130/79 127/88 107/64  Pulse:  100 (!) 107   Resp:  (!) 23 (!) 22 16  Temp:      TempSrc:      SpO2:  99% 99%   PainSc: 10-Worst pain ever       Isolation Precautions Airborne and Contact precautions  Medications Medications  sodium chloride flush (NS) 0.9 % injection 3 mL (3 mLs Intravenous Not Given 10/04/19 2348)  cefTRIAXone (ROCEPHIN) 2 g in sodium chloride 0.9 % 100 mL IVPB  (0 g Intravenous Stopped 10/04/19 2354)  azithromycin (ZITHROMAX) 500 mg in sodium chloride 0.9 % 250 mL IVPB (0 mg Intravenous Stopped 10/05/19 0129)  albuterol (VENTOLIN HFA) 108 (90 Base) MCG/ACT inhaler 2 puff (has no administration in time range)  acetaminophen (TYLENOL) tablet 650 mg (has no administration in time range)    Or  acetaminophen (TYLENOL) suppository 650 mg (has no  administration in time range)  ondansetron (ZOFRAN) tablet 4 mg (has no administration in time range)    Or  ondansetron (ZOFRAN) injection 4 mg (has no administration in time range)  enoxaparin (LOVENOX) injection 40 mg (has no administration in time range)  lactated ringers infusion ( Intravenous New Bag/Given 10/05/19 0028)  dexamethasone (DECADRON) injection 6 mg (6 mg Intravenous Given 10/05/19 0008)  acetaminophen (TYLENOL) tablet 1,000 mg (1,000 mg Oral Given 10/04/19 2300)  albuterol (VENTOLIN HFA) 108 (90 Base) MCG/ACT inhaler 4 puff (4 puffs Inhalation Given 10/04/19 2300)    Mobility walks Low fall risk   Focused Assessments Pulmonary Assessment Handoff:  Lung sounds:   O2 Device: Room Air        R Recommendations: See Admitting Provider Note  Report given to:   Additional Notes:

## 2019-10-05 NOTE — Progress Notes (Signed)
[facility]  PROGRESS NOTE  Ruth MuscaRachel Martinez WUJ:[id number] DOB: 11/01/1996 DOA: 10/04/2019 PCP: System, Provider Not In Admit date - 10/04/2019   Admitting Physician No admitting provider for patient encounter.  Outpatient Primary MD for the patient is System, Provider Not In  LOS - 1 Brief Narrative   Ruth Martinez is a 23 y.o. year old female with no significant medical history who presented on 10/04/2019 with worsening SOB and diarrhea, headache, fever and chills for several days. Recently diagnosed at urgent care on 11/5 with COVID 19 for which she self quarantined and was treated supportively with Augmentin, albuterol as needed inhaler, Mucinex  but had worsening SOB with non-productive cough prompting [facility].  Of note, patient reports a family sick contact being [place].  She knows several people from that wedding who have also contracted COVID-19.  In the ED found to have T-max of 101.3, tachypneic, tachycardic, chest x-ray showed patchy bilateral airspace opacities.  Patient was started empirically on azithromycin and ceftriaxone for CAP coverage and decadron due to hypoxia and SOB while awaiting Covid test which returned positive overnight.  Labwork notable for CRP 16.1, ferritin 268, D-dimer 0.45, procalcitonin < 0.1.   Subjective  FNAME@ [facility] today has no chest pain.  Does have mild cough, feels breathing better on oxygen  A & P    1. Acute hypoxic respiratory failure secondary to Covid pneumonia.  Given failed outpatient supportive care, worsening shortness of breath and hypoxia, will start remdesivir, continue IV Decadron started on admission.  Doubt bacterial pneumonia given chest x-ray imaging more consistent with viral infection and likely source being Covid and unremarkable procalcitonin, monitor blood cultures.  Monitor serial inflammatory markers.  [facility]   2. Sepsis without acute organ dysfunction secondary to Covid-19 pneumonia.  Presented with fever, tachycardia, tachypnea with  pneumonia, now known to be COVID-19 positive. But maintaining normal blood pressures with no acute organ dysfunction.  Continue treatment as above, currently hemodynamically stable     Family Communication  : None at bedside  Code Status : Full code  Disposition Plan  : Will need continued inpatient stay for IV remdesivir, IV Decadron for Covid pneumonia, monitoring hemodynamics  Consults  : None  Procedures  : None  DVT Prophylaxis  :  Lovenox   Lab Results  Component Value Date   PLT 362 10/05/2019    Diet :  Diet Order            Diet regular Room service appropriate? Yes; Fluid consistency: Thin  Diet effective now               Inpatient Medications Scheduled Meds: . dexamethasone (DECADRON) injection  6 mg Intravenous Q24H  . enoxaparin (LOVENOX) injection  40 mg Subcutaneous Q24H  . sodium chloride flush  3 mL Intravenous Once   Continuous Infusions: . azithromycin Stopped (10/05/19 0129)  . cefTRIAXone (ROCEPHIN)  IV Stopped (10/04/19 2354)  . lactated ringers 100 mL/hr at 10/05/19 0028   PRN Meds:.acetaminophen **OR** acetaminophen, albuterol, ondansetron **OR** ondansetron (ZOFRAN) IV  Antibiotics  :   Anti-infectives (From admission, onward)   Start     Dose/Rate Route Frequency Ordered Stop   10/04/19 2245  cefTRIAXone (ROCEPHIN) 2 g in sodium chloride 0.9 % 100 mL IVPB     2 g 200 mL/hr over 30 Minutes Intravenous Every 24 hours 10/04/19 2231     10/04/19 2245  azithromycin (  ZITHROMAX) 500 mg in sodium chloride 0.9 % 250 mL IVPB     500 mg 250 mL/hr over 60 Minutes Intravenous Every 24 hours 10/04/19 2231         Objective   Vitals:   10/05/19 0430 10/05/19 0500 10/05/19 0530 10/05/19 0545  BP: 110/71 121/75 115/82 122/80  Pulse: 86 81 82 82  Resp: (!) 24 18 19  (!) 29  Temp:      TempSrc:      SpO2: 95% 98% 96%  99%    SpO2: 99 %  Wt Readings from Last 3 Encounters:  No data found for Wt     Intake/Output Summary (Last 24 hours) at 10/05/2019 0813 Last data filed at 10/05/2019 0129 Gross per 24 hour  Intake 350 ml  Output -  Net 350 ml    Physical Exam:  Obese female, sitting comfortably in bed Awake Alert, Oriented X 3, Normal affect No new F.N deficits,  Hazard.AT, No JVD appreciated Symmetrical Chest wall movement, difficult to appreciate breath sounds, normal respiratory effort on 2 L O2 RRR,No Gallops,Rubs or new Murmurs,  No Cyanosis, Clubbing or edema, No new Rash or bruise    I have personally reviewed the following:   Data Reviewed:  CBC Recent Labs  Lab 10/04/19 2125 10/04/19 2349 10/05/19 0451  WBC 7.1 7.4 6.9  HGB 12.4 12.4 11.7*  HCT 39.5 38.7 36.1  PLT 391 357 362  MCV 94.5 93.0 92.3  MCH 29.7 29.8 29.9  MCHC 31.4 32.0 32.4  RDW 12.9 12.8 12.9  LYMPHSABS 1.4  --   --   MONOABS 0.9  --   --   EOSABS 0.0  --   --   BASOSABS 0.0  --   --     Chemistries  Recent Labs  Lab 10/04/19 2125 10/04/19 2349 10/05/19 0451  NA 135  --  137  K 3.2*  --  3.5  CL 98  --  102  CO2 24  --  23  GLUCOSE 109*  --  165*  BUN 7  --  <5*  CREATININE 0.77 0.70 0.73  CALCIUM 8.3*  --  8.3*  AST 29  --   --   ALT 23  --   --   ALKPHOS 38  --   --   BILITOT 0.5  --   --    ------------------------------------------------------------------------------------------------------------------ Recent Labs    10/04/19 2158  TRIG 97    No results found for: HGBA1C ------------------------------------------------------------------------------------------------------------------ No results for input(s): TSH, T4TOTAL, T3FREE, THYROIDAB in the last 72 hours.  Invalid input(s): FREET3 ------------------------------------------------------------------------------------------------------------------ Recent Labs    10/04/19 2158  FERRITIN 268    Coagulation profile No  results for input(s): INR, PROTIME in the last 168 hours.  Recent Labs    10/04/19 2158  DDIMER 0.45    Cardiac Enzymes No results for input(s): CKMB, TROPONINI, MYOGLOBIN in the last 168 hours.  Invalid input(s): CK ------------------------------------------------------------------------------------------------------------------ No results found for: BNP  Micro Results Recent Results (from the past 240 hour(s))  SARS CORONAVIRUS 2 (TAT 6-24 HRS) Nasopharyngeal Nasopharyngeal Swab     Status: Abnormal   Collection Time: 10/04/19 10:26 PM   Specimen: Nasopharyngeal Swab  Result Value Ref Range Status   SARS Coronavirus 2 POSITIVE (A) NEGATIVE Final    Comment: RESULT CALLED TO, READ BACK BY AND VERIFIED WITH: BRITTANY BECK RN.@0724  ON 11.14.2020 BY TCALDWELL MT. (NOTE) SARS-CoV-2 target nucleic acids are DETECTED. The SARS-CoV-2 RNA is generally detectable in  upper and lower respiratory specimens during the acute phase of infection. Positive results are indicative of active infection with SARS-CoV-2. Clinical  correlation with patient history and other diagnostic information is necessary to determine patient infection status. Positive results do  not rule out bacterial infection or co-infection with other viruses. The expected result is Negative. Fact Sheet for Patients: SugarRoll.be Fact Sheet for Healthcare Providers: https://www.woods-mathews.com/ This test is not yet approved or cleared by the [place] FDA and  has been authorized for detection and/or diagnosis of SARS-CoV-2 by FDA under an Emergency Use Authorization (EUA). This EUA will remain  in effect (meaning this test  can be used) for the duration of the COVID-19 declaration under Section 564(b)(1) of the Act, 21 U.S.C. section 360bbb-3(b)(1), unless the authorization is terminated or revoked sooner. Performed at [facility], [place] [address], [place],  [place] [address]     Radiology Reports Dg Chest Portable 1 View  Result Date: 10/04/2019 CLINICAL DATA:  COVID, shortness of breath EXAM: PORTABLE CHEST 1 VIEW COMPARISON:  None. FINDINGS: Heart is normal size. Bilateral hazy airspace opacities in the mid and lower lungs, left greater than right. Low lung volumes. No effusions. No acute bony abnormality. IMPRESSION: Patchy bilateral airspace opacities, left greater than right. Low lung volumes. Electronically Signed   By: Rolm Baptise M.D.   On: 10/04/2019 22:26     Time Spent in minutes  30     Desiree Hane M.D on 10/05/2019 at 8:13 AM  To page go to [email or web address] - password [facility]

## 2019-10-05 NOTE — Progress Notes (Signed)
Pharmacy Antibiotic Note  Adia Crammer is a 23 y.o. female admitted on 10/04/2019 with COVID-19 infection.  Pharmacy has been consulted for remdesivir dosing.  CXR with patchy bilerateral airspace opacities (L>R) and low lung volumes. O2 sat 96% on RA. ALT 23.  Plan: Remdesivir 200 mg IV x1, then 100 mg once daily x 4 days Plan to transfer to [facility] (24hrs), Avg:101.3 F (38.5 C), Min:101.3 F (38.5 C), Max:101.3 F (38.5 C)  Recent Labs  Lab 10/04/19 2125 10/04/19 2349 10/05/19 0451  WBC 7.1 7.4 6.9  CREATININE 0.77 0.70 0.73  LATICACIDVEN 1.3  --   --     CrCl cannot be calculated (Unknown ideal weight.).    No Known Allergies  Antimicrobials this admission: Remdesivir 11/14 >> 11/18 Ceftriaxone 11/13>> Azithromycin 11/13>>  Dose adjustments this admission: None  Microbiology results: 11/13 BCx: sent 11/13 COVID: positive  Thank you for allowing pharmacy to be a part of this patient's care.  Vertis Kelch, PharmD PGY2 Cardiology Pharmacy Resident Phone [phone] 10/05/2019       9:07 AM  Please check [email or web address] for unit-specific pharmacist phone numbers

## 2019-10-05 NOTE — Progress Notes (Signed)
IS and Flutter Valve education. Pt verbalized and returned demonstration.

## 2019-10-06 LAB — CBC WITH DIFFERENTIAL/PLATELET
Abs Immature Granulocytes: 0.05 10*3/uL (ref 0.00–0.07)
Basophils Absolute: 0 10*3/uL (ref 0.0–0.1)
Basophils Relative: 0 %
Eosinophils Absolute: 0 10*3/uL (ref 0.0–0.5)
Eosinophils Relative: 0 %
HCT: 38.5 % (ref 36.0–46.0)
Hemoglobin: 12 g/dL (ref 12.0–15.0)
Immature Granulocytes: 1 %
Lymphocytes Relative: 16 %
Lymphs Abs: 1.5 10*3/uL (ref 0.7–4.0)
MCH: 29.5 pg (ref 26.0–34.0)
MCHC: 31.2 g/dL (ref 30.0–36.0)
MCV: 94.6 fL (ref 80.0–100.0)
Monocytes Absolute: 0.9 10*3/uL (ref 0.1–1.0)
Monocytes Relative: 9 %
Neutro Abs: 6.7 10*3/uL (ref 1.7–7.7)
Neutrophils Relative %: 74 %
Platelets: 440 10*3/uL — ABNORMAL HIGH (ref 150–400)
RBC: 4.07 MIL/uL (ref 3.87–5.11)
RDW: 12.8 % (ref 11.5–15.5)
WBC: 9.2 10*3/uL (ref 4.0–10.5)
nRBC: 0 % (ref 0.0–0.2)

## 2019-10-06 LAB — COMPREHENSIVE METABOLIC PANEL
ALT: 20 U/L (ref 0–44)
AST: 20 U/L (ref 15–41)
Albumin: 3.3 g/dL — ABNORMAL LOW (ref 3.5–5.0)
Alkaline Phosphatase: 38 U/L (ref 38–126)
Anion gap: 10 (ref 5–15)
BUN: 11 mg/dL (ref 6–20)
CO2: 25 mmol/L (ref 22–32)
Calcium: 8.7 mg/dL — ABNORMAL LOW (ref 8.9–10.3)
Chloride: 103 mmol/L (ref 98–111)
Creatinine, Ser: 0.79 mg/dL (ref 0.44–1.00)
GFR calc Af Amer: >60 mL/min (ref >60)
GFR calc non Af Amer: >60 mL/min (ref >60)
Glucose, Bld: 141 mg/dL — ABNORMAL HIGH (ref 70–99)
Potassium: 4 mmol/L (ref 3.5–5.1)
Sodium: 138 mmol/L (ref 135–145)
Total Bilirubin: 0.6 mg/dL (ref 0.3–1.2)
Total Protein: 8.1 g/dL (ref 6.5–8.1)

## 2019-10-06 LAB — D-DIMER, QUANTITATIVE: D-Dimer, Quant: 0.36 ug/mL-FEU (ref 0.00–0.50)

## 2019-10-06 LAB — MAGNESIUM: Magnesium: 2.2 mg/dL (ref 1.7–2.4)

## 2019-10-06 LAB — C-REACTIVE PROTEIN: CRP: 11.1 mg/dL — ABNORMAL HIGH (ref <1.0)

## 2019-10-06 LAB — ABO/RH: ABO/RH(D): B POS

## 2019-10-06 MED ORDER — SODIUM CHLORIDE 0.9 % IV SOLN
1.0000 g | INTRAVENOUS | Status: AC
  Administered 2019-10-06 – 2019-10-08 (×3): 1 g via INTRAVENOUS
  Filled 2019-10-06 (×4): qty 10

## 2019-10-06 MED ORDER — ENOXAPARIN SODIUM 40 MG/0.4ML ~~LOC~~ SOLN
40.0000 mg | Freq: Two times a day (BID) | SUBCUTANEOUS | Status: DC
  Administered 2019-10-06 – 2019-10-07 (×2): 40 mg via SUBCUTANEOUS
  Filled 2019-10-06 (×2): qty 0.4

## 2019-10-06 NOTE — Evaluation (Addendum)
Occupational Therapy Evaluation Patient Details Name: Ruth Martinez MRN: [id number] DOB: Apr 27, 1996 Today's Date: 10/06/2019    History of Present Illness 23 y.o.femalewithno significant past medical history presents to the ER with complaint of worsening shortness of breath and diarrhea. On November 5 she went to urgent care and was diagnosed with Covid 19. She presented to the ER on 10/04/2019 with cough and mild shortness of breath was found to have COVID-19 pneumonia and admitted.   Clinical Impression   This 23 y/o female presents with the above. PTA pt reports independence with ADL, iADL and functional mobility, living alone. Pt performing functional transfers without AD, standing grooming and toileting ADL at supervision level this session, requires light minA for LB ADL completion. Pt performing multiple ADL tasks this session, min cues provided for taking rest breaks and pursed lip breathing but overall pt with good demonstration of self-monitoring, initiating rest breaks PRN. Pt does demonstrate decreased activity tolerance and requiring increased time/rest with ADL completion. Intermittently monitored O2 sats with activity (as pt no longer on tele) with O2 sats 88% and greater throughout. Initiated education of energy conservation strategies during ADL task after return home but pt will benefit from further review/education. Will continue to follow while she remains in acute setting to progress her towards her PLOF.      Follow Up Recommendations  No OT follow up;Supervision - Intermittent    Equipment Recommendations  Tub/shower seat(vs 3:1 for use in shower)    Recommendations for Other Services       Precautions / Restrictions Precautions Precaution Comments: monitor sats Restrictions Weight Bearing Restrictions: No      Mobility Bed Mobility               General bed mobility comments: OOB in recliner upon arrival  Transfers Overall transfer level: Modified  independent Equipment used: None             General transfer comment: assist only to stabilize recliner as brakes not very good. performed multiple sit<>stands during session/ADL completion    Balance Overall balance assessment: Needs assistance Sitting-balance support: Feet supported Sitting balance-Leahy Scale: Good     Standing balance support: No upper extremity supported;During functional activity Standing balance-Leahy Scale: Good Standing balance comment: pt propping LEs onto recliner seat during LB bathing tasks, no LOB with dynamic tasks                           ADL either performed or assessed with clinical judgement   ADL Overall ADL's : Needs assistance/impaired Eating/Feeding: Modified independent;Sitting   Grooming: Oral care;Wash/dry face;Sitting;Modified independent Grooming Details (indicate cue type and reason): seated and standing at sink in room, pt with decreased activity tolerance and not able to tolerate standing for long durations of activity at this time Upper Body Bathing: Modified independent;Sitting   Lower Body Bathing: Supervison/ safety;Sit to/from stand;Minimal assistance Lower Body Bathing Details (indicate cue type and reason): assist only for feet Upper Body Dressing : Modified independent;Sitting   Lower Body Dressing: Minimal assistance;Sit to/from stand Lower Body Dressing Details (indicate cue type and reason): assist only for donning socks, able to doff socks, don/doff underwear during session Toilet Transfer: Supervision/safety;Stand-pivot;BSC Toilet Transfer Details (indicate cue type and reason): use of BSC due to decreased activity tolerance post bathing ADL Toileting- Clothing Manipulation and Hygiene: Supervision/safety;Sit to/from stand;Sitting/lateral lean Toileting - Clothing Manipulation Details (indicate cue type and reason): pt mostly performing peri-care in standing  Functional mobility during ADLs:  Supervision/safety General ADL Comments: pt with decreased activity tolerance, increased WOB; pt self-monitoring and taking rest breaks PRN given min cues to do so; cued for pursed lip breathing with good carryover. performed bathing and dressing ADL at sink level in room     Vision         Perception     Praxis      Pertinent Vitals/Pain Pain Assessment: No/denies pain     Hand Dominance     Extremity/Trunk Assessment Upper Extremity Assessment Upper Extremity Assessment: Overall WFL for tasks assessed   Lower Extremity Assessment Lower Extremity Assessment: Defer to PT evaluation       Communication Communication Communication: No difficulties   Cognition Arousal/Alertness: Awake/alert Behavior During Therapy: WFL for tasks assessed/performed Overall Cognitive Status: Within Functional Limits for tasks assessed                                 General Comments: very pleasant   General Comments       Exercises     Shoulder Instructions      Home Living Family/patient expects to be discharged to:: Private residence Living Arrangements: Alone Available Help at Discharge: Family;Available PRN/intermittently Type of Home: Apartment Home Access: Stairs to enter Entrance Stairs-Number of Steps: flight Entrance Stairs-Rails: Right Home Layout: One level     Bathroom Shower/Tub: Teacher, early years/pre: Standard     Home Equipment: None          Prior Functioning/Environment Level of Independence: Independent        Comments: 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, hospital - hybrid online and in-person, 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        OT Problem List: Decreased strength;Decreased activity tolerance;Obesity;Cardiopulmonary status limiting activity;Decreased knowledge of use of DME or AE      OT Treatment/Interventions: Self-care/ADL training;Therapeutic exercise;Energy conservation;DME and/or AE  instruction;Therapeutic activities;Patient/family education;Balance training    OT Goals(Current goals can be found in the care plan section) Acute Rehab OT Goals Patient Stated Goal: maintain independence OT Goal Formulation: With patient Time For Goal Achievement: 10/20/19 Potential to Achieve Goals: Good  OT Frequency: Min 3X/week   Barriers to D/C:            Co-evaluation              AM-PAC OT "6 Clicks" Daily Activity     Outcome Measure Help from another person eating meals?: None Help from another person taking care of personal grooming?: None Help from another person toileting, which includes using toliet, bedpan, or urinal?: None Help from another person bathing (including washing, rinsing, drying)?: A Little Help from another person to put on and taking off regular upper body clothing?: None Help from another person to put on and taking off regular lower body clothing?: A Little 6 Click Score: 22   End of Session Nurse Communication: Mobility status  Activity Tolerance: Patient tolerated treatment well Patient left: in chair;with call bell/phone within reach  OT Visit Diagnosis: Muscle weakness (generalized) (M62.81);Other (comment)(decreased activity tolerance)                Time: 1478-2956 OT Time Calculation (min): 73 min Charges:  OT General Charges $OT Visit: 1 Visit OT Evaluation $OT Eval Moderate Complexity: 1 Mod OT Treatments $Self Care/Home Management : 53-67 mins  Lou Cal, OT Supplemental Rehabilitation  Services Pager [phone] Office [phone]   Orlando PennerBreanna L Ming Mcmannis 10/06/2019, 4:28 PM

## 2019-10-06 NOTE — Progress Notes (Signed)
MD in to see.   10/06/19 0800  Vitals  Temp 98.1 F (36.7 C)  Temp Source Oral  BP 129/78  MAP (mmHg) 92  BP Location Left Arm  BP Method Automatic  Patient Position (if appropriate) Sitting  Pulse Rate 86  Pulse Rate Source Monitor  ECG Heart Rate 86  Cardiac Rhythm NSR  Resp (!) 27  Oxygen Therapy  SpO2 95 %  O2 Device Room Air  Pain Assessment  Pain Scale 0-10  Pain Score 0  MEWS Score  MEWS RR 2  MEWS Pulse 0  MEWS Systolic 0  MEWS LOC 0  MEWS Temp 0  MEWS Score 2  MEWS Score Color Yellow  MEWS Assessment  Is this an acute change? No

## 2019-10-06 NOTE — Progress Notes (Signed)
PROGRESS NOTE                                                                                                                                                                                                             Patient Demographics:    Ruth Martinez, is a 23 y.o. female, DOB - August 09, 1996, INO:[id number]  Outpatient Primary MD for the patient is System, Provider Not In    LOS - 2  Admit date - 10/04/2019    Chief Complaint  Patient presents with  . COVID       Brief Narrative  Ruth Martinez is a 23 y.o. female with no significant past medical history presents to the ER with complaint of worsening shortness of breath and diarrhea.  Patient states around 2 weeks ago had gone to [place] to admit waiting 2 days later she returned back to [place] and started having headache and fever chills.  On November 5 she went to urgent care and was diagnosed with Covid 19.    She presented to the ER on 10/04/2019 with cough and mild shortness of breath was found to have COVID-19 pneumonia and admitted.   Subjective:    [facility] today has, No headache, No chest pain, No abdominal pain - No Nausea, No new weakness tingling or numbness, no Cough - SOB.     Assessment  & Plan :      Acute Covid 19 Viral Pneumonitis during the ongoing 2020 Covid 19 Pandemic - mild to moderate disease, mild hypoxia on presentation requiring 2 L nasal cannula oxygen, currently on room air and stable.  Continue IV steroids and remdesivir.  She has consented to convalescent plasma and Actemra use if needed.  Encouraged her to sit up in chair for pulmonary toiletry and use I-S and flutter valve in daytime.  Prone at night in bed.  Actemra off label use - patient was told that if COVID-19 pneumonitis gets worse we might potentially use Actemra off label, she denies any known history of tuberculosis or hepatitis, understands the risks and  benefits and wants to proceed with Actemra treatment if required.   SpO2: 95 % O2 Flow Rate (L/min): 2 L/min   COVID-19 Labs  Recent Labs    10/04/19 2158 10/06/19 0330  DDIMER 0.45 0.36  FERRITIN 268  --   LDH 293*  --  CRP 16.1* 11.1*    Lab Results  Component Value Date   SARSCOV2NAA POSITIVE (A) 10/04/2019   [place] Not Detected 06/05/2019    Hepatic Function Latest Ref Rng & Units 10/06/2019 10/04/2019  Total Protein 6.5 - 8.1 g/dL 8.1 7.9  Albumin 3.5 - 5.0 g/dL 3.3(L) 3.0(L)  AST 15 - 41 U/L 20 29  ALT 0 - 44 U/L 20 23  Alk Phosphatase 38 - 126 U/L 38 38  Total Bilirubin 0.3 - 1.2 mg/dL 0.6 0.5     No results found for: BNP  2.  Morbid obesity.  BMI of 58.  Follow with PCP for weight loss.  3.  19 gastroenteritis.  Much improved and almost resolved.     Condition - Fair  Family Communication  :  None  Code Status :  Full  Diet :   Diet Order            Diet regular Room service appropriate? Yes; Fluid consistency: Thin  Diet effective now               Disposition Plan  :  Home  Consults  :  NONE  Procedures  :   PUD Prophylaxis :   DVT Prophylaxis  :  Lovenox   Lab Results  Component Value Date   PLT 440 (H) 10/06/2019    Inpatient Medications  Scheduled Meds: . dexamethasone (DECADRON) injection  6 mg Intravenous Q24H  . enoxaparin (LOVENOX) injection  40 mg Subcutaneous Q24H  . mouth rinse  15 mL Mouth Rinse BID  . sodium chloride flush  3 mL Intravenous Once   Continuous Infusions: . cefTRIAXone (ROCEPHIN)  IV    . remdesivir 100 mg in NS 250 mL     PRN Meds:.acetaminophen **OR** [DISCONTINUED] acetaminophen, albuterol, [DISCONTINUED] ondansetron **OR** ondansetron (ZOFRAN) IV  Antibiotics  :    Anti-infectives (From admission, onward)   Start     Dose/Rate Route Frequency Ordered Stop   10/06/19 1000  remdesivir 100 mg in sodium chloride 0.9 % 250 mL IVPB     100 mg 500 mL/hr over 30 Minutes Intravenous  Every 24 hours 10/05/19 0902 10/10/19 0959   10/06/19 0730  cefTRIAXone (ROCEPHIN) 1 g in sodium chloride 0.9 % 100 mL IVPB     1 g 200 mL/hr over 30 Minutes Intravenous Every 24 hours 10/06/19 0710 10/09/19 0729   10/05/19 0930  remdesivir 200 mg in sodium chloride 0.9 % 250 mL IVPB     200 mg 500 mL/hr over 30 Minutes Intravenous Once 10/05/19 0902 10/05/19 1500   10/04/19 2245  cefTRIAXone (ROCEPHIN) 2 g in sodium chloride 0.9 % 100 mL IVPB  Status:  Discontinued     2 g 200 mL/hr over 30 Minutes Intravenous Every 24 hours 10/04/19 2231 10/05/19 1336   10/04/19 2245  azithromycin (ZITHROMAX) 500 mg in sodium chloride 0.9 % 250 mL IVPB  Status:  Discontinued     500 mg 250 mL/hr over 60 Minutes Intravenous Every 24 hours 10/04/19 2231 10/05/19 1336       Time Spent in minutes  30   Ruth Martinez M.D on 10/06/2019 at 10:52 AM  To page go to [email or web address] - password [facility]  [facility] -  Office  [phone]    See all Orders from today for further details    Objective:   Vitals:   10/06/19 0200 10/06/19 0634 10/06/19 0700 10/06/19 0800  BP:  131/78 134/87 129/78  Pulse:  95 85  86  Resp:  (!) 28 (!) 30 (!) 27  Temp:    98.1 F (36.7 C)  TempSrc:    Oral  SpO2:  96% 96% 95%  Weight: (!) 178.6 kg     Height: '5\' 9"'$  (1.753 m)       Wt Readings from Last 3 Encounters:  10/06/19 (!) 178.6 kg     Intake/Output Summary (Last 24 hours) at 10/06/2019 1052 Last data filed at 10/06/2019 0600 Gross per 24 hour  Intake 1655.49 ml  Output 1001 ml  Net 654.49 ml     Physical Exam  Awake Alert, Oriented X 3, No new F.N deficits, Normal affect [place].AT,PERRAL Supple Neck,No JVD, No cervical lymphadenopathy appriciated.  Symmetrical Chest wall movement, Good air movement bilaterally, CTAB RRR,No Gallops,Rubs or new Murmurs, No Parasternal Heave +ve B.Sounds, Abd Soft, No tenderness, No organomegaly appriciated, No rebound - guarding or rigidity. No Cyanosis,  Clubbing or edema, No new Rash or bruise       Data Review:    CBC Recent Labs  Lab 10/04/19 2125 10/04/19 2349 10/05/19 0451 10/06/19 0330  WBC 7.1 7.4 6.9 9.2  HGB 12.4 12.4 11.7* 12.0  HCT 39.5 38.7 36.1 38.5  PLT 391 357 362 440*  MCV 94.5 93.0 92.3 94.6  MCH 29.7 29.8 29.9 29.5  MCHC 31.4 32.0 32.4 31.2  RDW 12.9 12.8 12.9 12.8  LYMPHSABS 1.4  --   --  1.5  MONOABS 0.9  --   --  0.9  EOSABS 0.0  --   --  0.0  BASOSABS 0.0  --   --  0.0    Chemistries  Recent Labs  Lab 10/04/19 2125 10/04/19 2349 10/05/19 0451 10/06/19 0330  NA 135  --  137 138  K 3.2*  --  3.5 4.0  CL 98  --  102 103  CO2 24  --  23 25  GLUCOSE 109*  --  165* 141*  BUN 7  --  <5* 11  CREATININE 0.77 0.70 0.73 0.79  CALCIUM 8.3*  --  8.3* 8.7*  MG  --   --   --  2.2  AST 29  --   --  20  ALT 23  --   --  20  ALKPHOS 38  --   --  38  BILITOT 0.5  --   --  0.6   ------------------------------------------------------------------------------------------------------------------ Recent Labs    10/04/19 2158  TRIG 97    No results found for: HGBA1C ------------------------------------------------------------------------------------------------------------------ No results for input(s): TSH, T4TOTAL, T3FREE, THYROIDAB in the last 72 hours.  Invalid input(s): FREET3  Cardiac Enzymes No results for input(s): CKMB, TROPONINI, MYOGLOBIN in the last 168 hours.  Invalid input(s): CK ------------------------------------------------------------------------------------------------------------------ No results found for: BNP  Micro Results Recent Results (from the past 240 hour(s))  SARS CORONAVIRUS 2 (TAT 6-24 HRS) Nasopharyngeal Nasopharyngeal Swab     Status: Abnormal   Collection Time: 10/04/19 10:26 PM   Specimen: Nasopharyngeal Swab  Result Value Ref Range Status   SARS Coronavirus 2 POSITIVE (A) NEGATIVE Final    Comment: RESULT CALLED TO, READ BACK BY AND VERIFIED WITH: BRITTANY  BECK RN.'@[id number]'$  ON 11.14.2020 BY TCALDWELL MT. (NOTE) SARS-CoV-2 target nucleic acids are DETECTED. The SARS-CoV-2 RNA is generally detectable in upper and lower respiratory specimens during the acute phase of infection. Positive results are indicative of active infection with SARS-CoV-2. Clinical  correlation with patient history and other diagnostic information is necessary to determine patient infection status. Positive  results do  not rule out bacterial infection or co-infection with other viruses. The expected result is Negative. Fact Sheet for Patients: SugarRoll.be Fact Sheet for Healthcare Providers: https://www.woods-mathews.com/ This test is not yet approved or cleared by the [place] FDA and  has been authorized for detection and/or diagnosis of SARS-CoV-2 by FDA under an Emergency Use Authorization (EUA). This EUA will remain  in effect (meaning this test  can be used) for the duration of the COVID-19 declaration under Section 564(b)(1) of the Act, 21 U.S.C. section 360bbb-3(b)(1), unless the authorization is terminated or revoked sooner. Performed at [facility], [place] [address], [place], [place] [address]   Blood Culture (routine x 2)     Status: None (Preliminary result)   Collection Time: 10/04/19 10:50 PM   Specimen: BLOOD RIGHT ARM  Result Value Ref Range Status   Specimen Description BLOOD RIGHT ARM  Final   Special Requests   Final    BOTTLES DRAWN AEROBIC AND ANAEROBIC Blood Culture adequate volume   Culture   Final    NO GROWTH < 24 HOURS Performed at [facility], [place] [address] [place], [place] [address]    Report Status PENDING  Incomplete  Blood Culture (routine x 2)     Status: None (Preliminary result)   Collection Time: 10/04/19 11:01 PM   Specimen: BLOOD RIGHT HAND  Result Value Ref Range Status   Specimen Description BLOOD RIGHT HAND  Final   Special Requests   Final    BOTTLES DRAWN  AEROBIC AND ANAEROBIC Blood Culture adequate volume   Culture   Final    NO GROWTH < 24 HOURS Performed at [facility], [place] [address], [place], [place] [address]    Report Status PENDING  Incomplete    Radiology Reports Dg Chest Portable 1 View  Result Date: 10/04/2019 CLINICAL DATA:  COVID, shortness of breath EXAM: PORTABLE CHEST 1 VIEW COMPARISON:  None. FINDINGS: Heart is normal size. Bilateral hazy airspace opacities in the mid and lower lungs, left greater than right. Low lung volumes. No effusions. No acute bony abnormality. IMPRESSION: Patchy bilateral airspace opacities, left greater than right. Low lung volumes. Electronically Signed   By: Rolm Baptise M.D.   On: 10/04/2019 22:26

## 2019-10-07 LAB — CBC WITH DIFFERENTIAL/PLATELET
Abs Immature Granulocytes: 0.1 10*3/uL — ABNORMAL HIGH (ref 0.00–0.07)
Basophils Absolute: 0 10*3/uL (ref 0.0–0.1)
Basophils Relative: 0 %
Eosinophils Absolute: 0 10*3/uL (ref 0.0–0.5)
Eosinophils Relative: 0 %
HCT: 37.7 % (ref 36.0–46.0)
Hemoglobin: 12.1 g/dL (ref 12.0–15.0)
Immature Granulocytes: 1 %
Lymphocytes Relative: 13 %
Lymphs Abs: 1.3 10*3/uL (ref 0.7–4.0)
MCH: 29.8 pg (ref 26.0–34.0)
MCHC: 32.1 g/dL (ref 30.0–36.0)
MCV: 92.9 fL (ref 80.0–100.0)
Monocytes Absolute: 0.6 10*3/uL (ref 0.1–1.0)
Monocytes Relative: 7 %
Neutro Abs: 7.9 10*3/uL — ABNORMAL HIGH (ref 1.7–7.7)
Neutrophils Relative %: 79 %
Platelets: 515 10*3/uL — ABNORMAL HIGH (ref 150–400)
RBC: 4.06 MIL/uL (ref 3.87–5.11)
RDW: 12.7 % (ref 11.5–15.5)
WBC: 9.9 10*3/uL (ref 4.0–10.5)
nRBC: 0 % (ref 0.0–0.2)

## 2019-10-07 LAB — MAGNESIUM: Magnesium: 2.2 mg/dL (ref 1.7–2.4)

## 2019-10-07 LAB — COMPREHENSIVE METABOLIC PANEL
ALT: 19 U/L (ref 0–44)
AST: 18 U/L (ref 15–41)
Albumin: 3.3 g/dL — ABNORMAL LOW (ref 3.5–5.0)
Alkaline Phosphatase: 41 U/L (ref 38–126)
Anion gap: 13 (ref 5–15)
BUN: 11 mg/dL (ref 6–20)
CO2: 22 mmol/L (ref 22–32)
Calcium: 8.6 mg/dL — ABNORMAL LOW (ref 8.9–10.3)
Chloride: 106 mmol/L (ref 98–111)
Creatinine, Ser: 0.53 mg/dL (ref 0.44–1.00)
GFR calc Af Amer: >60 mL/min (ref >60)
GFR calc non Af Amer: >60 mL/min (ref >60)
Glucose, Bld: 153 mg/dL — ABNORMAL HIGH (ref 70–99)
Potassium: 3.9 mmol/L (ref 3.5–5.1)
Sodium: 141 mmol/L (ref 135–145)
Total Bilirubin: 0.4 mg/dL (ref 0.3–1.2)
Total Protein: 8.1 g/dL (ref 6.5–8.1)

## 2019-10-07 LAB — C-REACTIVE PROTEIN: CRP: 5.5 mg/dL — ABNORMAL HIGH (ref <1.0)

## 2019-10-07 LAB — D-DIMER, QUANTITATIVE: D-Dimer, Quant: 0.43 ug/mL-FEU (ref 0.00–0.50)

## 2019-10-07 MED ORDER — ENOXAPARIN SODIUM 60 MG/0.6ML ~~LOC~~ SOLN
50.0000 mg | Freq: Once | SUBCUTANEOUS | Status: AC
  Administered 2019-10-07: 50 mg via SUBCUTANEOUS
  Filled 2019-10-07: qty 0.6

## 2019-10-07 MED ORDER — ENOXAPARIN SODIUM 100 MG/ML ~~LOC~~ SOLN
0.5000 mg/kg | SUBCUTANEOUS | Status: DC
  Administered 2019-10-08 – 2019-10-09 (×2): 90 mg via SUBCUTANEOUS
  Filled 2019-10-07 (×3): qty 1

## 2019-10-07 NOTE — Progress Notes (Signed)
PROGRESS NOTE                                                                                                                                                                                                             Patient Demographics:    Ruth Martinez, is a 23 y.o. female, DOB - [birth year], GGY:[id number]  Outpatient Primary MD for the patient is System, Provider Not In    LOS - 3  Admit date - 10/04/2019    Chief Complaint  Patient presents with  . COVID       Brief Narrative  Ruth Martinez is a 23 y.o. female with no significant past medical history presents to the ER with complaint of worsening shortness of breath and diarrhea.  Patient states around 2 weeks ago had gone to [place] to admit waiting 2 days later she returned back to [place] and started having headache and fever chills.  On November 5 she went to urgent care and was diagnosed with Covid 19.    She presented to the ER on 10/04/2019 with cough and mild shortness of breath was found to have COVID-19 pneumonia and admitted.   Subjective:   Patient in bed, appears comfortable, denies any headache, no fever, no chest pain or pressure, no shortness of breath , no abdominal pain. No focal weakness.     Assessment  & Plan :      Acute Covid 19 Viral Pneumonitis during the ongoing 2020 Covid 19 Pandemic - mild to moderate disease, mild hypoxia on presentation requiring 2 L nasal cannula oxygen, currently on room air and stable.  Continue IV steroids and remdesivir.  She has consented to convalescent plasma and Actemra use if needed.  Encouraged her to sit up in chair for pulmonary toiletry and use I-S and flutter valve in daytime.  Prone at night in bed.  Actemra off label use - patient was told that if COVID-19 pneumonitis gets worse we might potentially use Actemra off label, she denies any known history of tuberculosis or hepatitis, understands  the risks and benefits and wants to proceed with Actemra treatment if required.   SpO2: 94 % O2 Flow Rate (L/min): 2 L/min   COVID-19 Labs  Recent Labs    10/04/19 2158 10/06/19 0330 10/07/19 0417  DDIMER 0.45 0.36 0.43  FERRITIN 268  --   --  LDH 293*  --   --   CRP 16.1* 11.1* 5.5*    Lab Results  Component Value Date   SARSCOV2NAA POSITIVE (A) 10/04/2019   [place] Not Detected 06/05/2019    Hepatic Function Latest Ref Rng & Units 10/07/2019 10/06/2019 10/04/2019  Total Protein 6.5 - 8.1 g/dL 8.1 8.1 7.9  Albumin 3.5 - 5.0 g/dL 3.3(L) 3.3(L) 3.0(L)  AST 15 - 41 U/L '18 20 29  '$ ALT 0 - 44 U/L '19 20 23  '$ Alk Phosphatase 38 - 126 U/L 41 38 38  Total Bilirubin 0.3 - 1.2 mg/dL 0.4 0.6 0.5     No results found for: BNP  2.  Morbid obesity.  BMI of 58.  Follow with PCP for weight loss.  3.  19 gastroenteritis.  Much improved and almost resolved.     Condition - Fair  Family Communication  :  None  Code Status :  Full  Diet :   Diet Order            Diet regular Room service appropriate? Yes; Fluid consistency: Thin  Diet effective now               Disposition Plan  :  Home  Consults  :  NONE  Procedures  :   PUD Prophylaxis :   DVT Prophylaxis  :  Lovenox   Lab Results  Component Value Date   PLT 515 (H) 10/07/2019    Inpatient Medications  Scheduled Meds: . dexamethasone (DECADRON) injection  6 mg Intravenous Q24H  . enoxaparin (LOVENOX) injection  40 mg Subcutaneous Q12H  . mouth rinse  15 mL Mouth Rinse BID  . sodium chloride flush  3 mL Intravenous Once   Continuous Infusions: . cefTRIAXone (ROCEPHIN)  IV 1 g (10/07/19 0819)  . remdesivir 100 mg in NS 250 mL 100 mg (10/07/19 0929)   PRN Meds:.acetaminophen **OR** [DISCONTINUED] acetaminophen, albuterol, [DISCONTINUED] ondansetron **OR** ondansetron (ZOFRAN) IV  Antibiotics  :    Anti-infectives (From admission, onward)   Start     Dose/Rate Route Frequency Ordered Stop    10/06/19 1000  remdesivir 100 mg in sodium chloride 0.9 % 250 mL IVPB     100 mg 500 mL/hr over 30 Minutes Intravenous Every 24 hours 10/05/19 0902 10/10/19 0959   10/06/19 0730  cefTRIAXone (ROCEPHIN) 1 g in sodium chloride 0.9 % 100 mL IVPB     1 g 200 mL/hr over 30 Minutes Intravenous Every 24 hours 10/06/19 0710 10/09/19 0729   10/05/19 0930  remdesivir 200 mg in sodium chloride 0.9 % 250 mL IVPB     200 mg 500 mL/hr over 30 Minutes Intravenous Once 10/05/19 0902 10/05/19 1500   10/04/19 2245  cefTRIAXone (ROCEPHIN) 2 g in sodium chloride 0.9 % 100 mL IVPB  Status:  Discontinued     2 g 200 mL/hr over 30 Minutes Intravenous Every 24 hours 10/04/19 2231 10/05/19 1336   10/04/19 2245  azithromycin (ZITHROMAX) 500 mg in sodium chloride 0.9 % 250 mL IVPB  Status:  Discontinued     500 mg 250 mL/hr over 60 Minutes Intravenous Every 24 hours 10/04/19 2231 10/05/19 1336       Time Spent in minutes  30   Lala Lund M.D on 10/07/2019 at 9:50 AM  To page go to [email or web address] - password [email or web address]  [facility] -  Office  [phone]    See all Orders from today for further details    Objective:   Vitals:  10/06/19 1600 10/06/19 2112 10/07/19 0055 10/07/19 0538  BP: 128/76 135/79  135/87  Pulse: 88 93 86 96  Resp: (!) 27 (!) 24 (!) 22 18  Temp:  98.4 F (36.9 C) 98.3 F (36.8 C) 98.2 F (36.8 C)  TempSrc:  Oral Oral Oral  SpO2:  96% 96% 94%  Weight:      Height:        Wt Readings from Last 3 Encounters:  10/06/19 (!) 178.6 kg     Intake/Output Summary (Last 24 hours) at 10/07/2019 0950 Last data filed at 10/07/2019 0200 Gross per 24 hour  Intake 720 ml  Output -  Net 720 ml     Physical Exam  Awake Alert, Oriented X 3, No new F.N deficits, Normal affect [place].AT,PERRAL Supple Neck,No JVD, No cervical lymphadenopathy appriciated.  Symmetrical Chest wall movement, Good air movement bilaterally, CTAB RRR,No Gallops, Rubs or new Murmurs, No Parasternal  Heave +ve B.Sounds, Abd Soft, No tenderness, No organomegaly appriciated, No rebound - guarding or rigidity. No Cyanosis, Clubbing or edema, No new Rash or bruise    Data Review:    CBC Recent Labs  Lab 10/04/19 2125 10/04/19 2349 10/05/19 0451 10/06/19 0330 10/07/19 0417  WBC 7.1 7.4 6.9 9.2 9.9  HGB 12.4 12.4 11.7* 12.0 12.1  HCT 39.5 38.7 36.1 38.5 37.7  PLT 391 357 362 440* 515*  MCV 94.5 93.0 92.3 94.6 92.9  MCH 29.7 29.8 29.9 29.5 29.8  MCHC 31.4 32.0 32.4 31.2 32.1  RDW 12.9 12.8 12.9 12.8 12.7  LYMPHSABS 1.4  --   --  1.5 1.3  MONOABS 0.9  --   --  0.9 0.6  EOSABS 0.0  --   --  0.0 0.0  BASOSABS 0.0  --   --  0.0 0.0    Chemistries  Recent Labs  Lab 10/04/19 2125 10/04/19 2349 10/05/19 0451 10/06/19 0330 10/07/19 0417  NA 135  --  137 138 141  K 3.2*  --  3.5 4.0 3.9  CL 98  --  102 103 106  CO2 24  --  '23 25 22  '$ GLUCOSE 109*  --  165* 141* 153*  BUN 7  --  <5* 11 11  CREATININE 0.77 0.70 0.73 0.79 0.53  CALCIUM 8.3*  --  8.3* 8.7* 8.6*  MG  --   --   --  2.2 2.2  AST 29  --   --  20 18  ALT 23  --   --  20 19  ALKPHOS 38  --   --  38 41  BILITOT 0.5  --   --  0.6 0.4   ------------------------------------------------------------------------------------------------------------------ Recent Labs    10/04/19 2158  TRIG 97    No results found for: HGBA1C ------------------------------------------------------------------------------------------------------------------ No results for input(s): TSH, T4TOTAL, T3FREE, THYROIDAB in the last 72 hours.  Invalid input(s): FREET3  Cardiac Enzymes No results for input(s): CKMB, TROPONINI, MYOGLOBIN in the last 168 hours.  Invalid input(s): CK ------------------------------------------------------------------------------------------------------------------ No results found for: BNP  Micro Results Recent Results (from the past 240 hour(s))  SARS CORONAVIRUS 2 (TAT 6-24 HRS) Nasopharyngeal  Nasopharyngeal Swab     Status: Abnormal   Collection Time: 10/04/19 10:26 PM   Specimen: Nasopharyngeal Swab  Result Value Ref Range Status   SARS Coronavirus 2 POSITIVE (A) NEGATIVE Final    Comment: RESULT CALLED TO, READ BACK BY AND VERIFIED WITH: BRITTANY BECK RN.'@[id number]'$  ON 11.14.2020 BY TCALDWELL MT. (NOTE) SARS-CoV-2 target nucleic acids are  DETECTED. The SARS-CoV-2 RNA is generally detectable in upper and lower respiratory specimens during the acute phase of infection. Positive results are indicative of active infection with SARS-CoV-2. Clinical  correlation with patient history and other diagnostic information is necessary to determine patient infection status. Positive results do  not rule out bacterial infection or co-infection with other viruses. The expected result is Negative. Fact Sheet for Patients: SugarRoll.be Fact Sheet for Healthcare Providers: https://www.woods-mathews.com/ This test is not yet approved or cleared by the [place] FDA and  has been authorized for detection and/or diagnosis of SARS-CoV-2 by FDA under an Emergency Use Authorization (EUA). This EUA will remain  in effect (meaning this test  can be used) for the duration of the COVID-19 declaration under Section 564(b)(1) of the Act, 21 U.S.C. section 360bbb-3(b)(1), unless the authorization is terminated or revoked sooner. Performed at [facility], [place] [address], [place], [place] [address]   Blood Culture (routine x 2)     Status: None (Preliminary result)   Collection Time: 10/04/19 10:50 PM   Specimen: BLOOD RIGHT ARM  Result Value Ref Range Status   Specimen Description BLOOD RIGHT ARM  Final   Special Requests   Final    BOTTLES DRAWN AEROBIC AND ANAEROBIC Blood Culture adequate volume   Culture   Final    NO GROWTH 2 DAYS Performed at [facility], [place] [address], [place], [place] [address]    Report Status PENDING  Incomplete   Blood Culture (routine x 2)     Status: None (Preliminary result)   Collection Time: 10/04/19 11:01 PM   Specimen: BLOOD RIGHT HAND  Result Value Ref Range Status   Specimen Description BLOOD RIGHT HAND  Final   Special Requests   Final    BOTTLES DRAWN AEROBIC AND ANAEROBIC Blood Culture adequate volume   Culture   Final    NO GROWTH 2 DAYS Performed at [facility], [place] [address], [place], [place] [address]    Report Status PENDING  Incomplete    Radiology Reports Dg Chest Portable 1 View  Result Date: 10/04/2019 CLINICAL DATA:  COVID, shortness of breath EXAM: PORTABLE CHEST 1 VIEW COMPARISON:  None. FINDINGS: Heart is normal size. Bilateral hazy airspace opacities in the mid and lower lungs, left greater than right. Low lung volumes. No effusions. No acute bony abnormality. IMPRESSION: Patchy bilateral airspace opacities, left greater than right. Low lung volumes. Electronically Signed   By: Rolm Baptise M.D.   On: 10/04/2019 22:26

## 2019-10-07 NOTE — Evaluation (Signed)
Physical Therapy Evaluation Patient Details Name: Ruth Martinez MRN: [id number] DOB: June 10, 1996 Today's Date: 10/07/2019   History of Present Illness  23 y.o.femalewithno significant past medical history presents to the ER with complaint of worsening shortness of breath and diarrhea. On November 5 she went to urgent care and was diagnosed with Covid 19. She presented to the ER on 10/04/2019 with cough and mild shortness of breath was found to have COVID-19 pneumonia and admitted.  Clinical Impression  Pt presents to PT with decr mobility due to dyspnea. Will follow acutely but do not feel pt will need PT at time of DC.     Follow Up Recommendations No PT follow up    Equipment Recommendations  None recommended by PT    Recommendations for Other Services       Precautions / Restrictions Precautions Precautions: Other (comment) Precaution Comments: monitor sats Restrictions Weight Bearing Restrictions: No      Mobility  Bed Mobility               General bed mobility comments: Pt up in recliner  Transfers Overall transfer level: Modified independent Equipment used: None                Ambulation/Gait Ambulation/Gait assistance: Supervision Gait Distance (Feet): 200 Feet Assistive device: None Gait Pattern/deviations: Step-through pattern;Decreased stride length;Wide base of support Gait velocity: decr Gait velocity interpretation: <1.31 ft/sec, indicative of household ambulator General Gait Details: Steady gait with supervision to monitor SpO2. Pt with slow cadence and dyspnea 2/4 and SpO2> 93%.   Stairs            Wheelchair Mobility    Modified Rankin (Stroke Patients Only)       Balance Overall balance assessment: No apparent balance deficits (not formally assessed)                                           Pertinent Vitals/Pain Pain Assessment: No/denies pain    Home Living Family/patient expects to be  discharged to:: Private residence Living Arrangements: Alone Available Help at Discharge: Family;Available PRN/intermittently Type of Home: Apartment Home Access: Stairs to enter Entrance Stairs-Rails: Right Entrance Stairs-Number of Steps: flight Home Layout: One level Home Equipment: None      Prior Function Level of Independence: Independent         Comments: 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, hospital - hybrid online and in-person, 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     Hand Dominance        Extremity/Trunk Assessment   Upper Extremity Assessment Upper Extremity Assessment: Defer to OT evaluation    Lower Extremity Assessment Lower Extremity Assessment: Overall WFL for tasks assessed       Communication   Communication: No difficulties  Cognition Arousal/Alertness: Awake/alert Behavior During Therapy: WFL for tasks assessed/performed Overall Cognitive Status: Within Functional Limits for tasks assessed                                        General Comments      Exercises     Assessment/Plan    PT Assessment Patient needs continued PT services  PT Problem List Cardiopulmonary status limiting activity;Decreased mobility;Decreased activity tolerance       PT Treatment Interventions Gait  training;Stair training;Therapeutic activities;Therapeutic exercise;Patient/family education    PT Goals (Current goals can be found in the Care Plan section)  Acute Rehab PT Goals Patient Stated Goal: return home PT Goal Formulation: With patient Time For Goal Achievement: 10/14/19 Potential to Achieve Goals: Good Additional Goals Additional Goal #1: Will perform exercise program including frequent in room ambulation to incr activity tolerance.    Frequency Min 3X/week   Barriers to discharge        Co-evaluation               AM-PAC PT "6 Clicks" Mobility  Outcome Measure Help needed turning from your back to your side  while in a flat bed without using bedrails?: None Help needed moving from lying on your back to sitting on the side of a flat bed without using bedrails?: None Help needed moving to and from a bed to a chair (including a wheelchair)?: None Help needed standing up from a chair using your arms (e.g., wheelchair or bedside chair)?: None Help needed to walk in hospital room?: None Help needed climbing 3-5 steps with a railing? : A Little 6 Click Score: 23    End of Session   Activity Tolerance: Other (comment)(limited by dyspnea) Patient left: in chair;with call bell/phone within reach   PT Visit Diagnosis: Difficulty in walking, not elsewhere classified (R26.2)    Time: 1006-1020 PT Time Calculation (min) (ACUTE ONLY): 14 min   Charges:   PT Evaluation $PT Eval Low Complexity: 1 Low          [facility] PT Acute Rehabilitation Services Pager [phone] Office [phone]   Angelina Ok [facility] 10/07/2019, 10:42 AM

## 2019-10-07 NOTE — Plan of Care (Signed)
  Problem: Education: Goal: Knowledge of risk factors and measures for prevention of condition will improve Outcome: Progressing   

## 2019-10-08 LAB — CBC WITH DIFFERENTIAL/PLATELET
Abs Immature Granulocytes: 0.08 10*3/uL — ABNORMAL HIGH (ref 0.00–0.07)
Basophils Absolute: 0 10*3/uL (ref 0.0–0.1)
Basophils Relative: 0 %
Eosinophils Absolute: 0 10*3/uL (ref 0.0–0.5)
Eosinophils Relative: 0 %
HCT: 38.8 % (ref 36.0–46.0)
Hemoglobin: 12.1 g/dL (ref 12.0–15.0)
Immature Granulocytes: 1 %
Lymphocytes Relative: 20 %
Lymphs Abs: 1.8 10*3/uL (ref 0.7–4.0)
MCH: 29.7 pg (ref 26.0–34.0)
MCHC: 31.2 g/dL (ref 30.0–36.0)
MCV: 95.1 fL (ref 80.0–100.0)
Monocytes Absolute: 1 10*3/uL (ref 0.1–1.0)
Monocytes Relative: 11 %
Neutro Abs: 6.1 10*3/uL (ref 1.7–7.7)
Neutrophils Relative %: 68 %
Platelets: 518 10*3/uL — ABNORMAL HIGH (ref 150–400)
RBC: 4.08 MIL/uL (ref 3.87–5.11)
RDW: 12.8 % (ref 11.5–15.5)
WBC: 9 10*3/uL (ref 4.0–10.5)
nRBC: 0 % (ref 0.0–0.2)

## 2019-10-08 LAB — COMPREHENSIVE METABOLIC PANEL
ALT: 23 U/L (ref 0–44)
AST: 23 U/L (ref 15–41)
Albumin: 3.2 g/dL — ABNORMAL LOW (ref 3.5–5.0)
Alkaline Phosphatase: 40 U/L (ref 38–126)
Anion gap: 12 (ref 5–15)
BUN: 11 mg/dL (ref 6–20)
CO2: 24 mmol/L (ref 22–32)
Calcium: 8.4 mg/dL — ABNORMAL LOW (ref 8.9–10.3)
Chloride: 103 mmol/L (ref 98–111)
Creatinine, Ser: 0.67 mg/dL (ref 0.44–1.00)
GFR calc Af Amer: >60 mL/min (ref >60)
GFR calc non Af Amer: >60 mL/min (ref >60)
Glucose, Bld: 83 mg/dL (ref 70–99)
Potassium: 3.8 mmol/L (ref 3.5–5.1)
Sodium: 139 mmol/L (ref 135–145)
Total Bilirubin: 0.6 mg/dL (ref 0.3–1.2)
Total Protein: 7.6 g/dL (ref 6.5–8.1)

## 2019-10-08 LAB — MAGNESIUM: Magnesium: 2.1 mg/dL (ref 1.7–2.4)

## 2019-10-08 LAB — C-REACTIVE PROTEIN: CRP: 2.7 mg/dL — ABNORMAL HIGH (ref <1.0)

## 2019-10-08 LAB — D-DIMER, QUANTITATIVE: D-Dimer, Quant: 0.38 ug/mL-FEU (ref 0.00–0.50)

## 2019-10-08 MED ORDER — DEXAMETHASONE SODIUM PHOSPHATE 4 MG/ML IJ SOLN
4.0000 mg | INTRAMUSCULAR | Status: DC
  Administered 2019-10-08: 4 mg via INTRAVENOUS
  Filled 2019-10-08: qty 1

## 2019-10-08 NOTE — Plan of Care (Signed)
  Problem: Respiratory: Goal: Will maintain a patent airway Outcome: Progressing Goal: Complications related to the disease process, condition or treatment will be avoided or minimized Outcome: Progressing   

## 2019-10-08 NOTE — Evaluation (Signed)
Occupational Therapy Treatment Patient Details Name: Ruth Martinez MRN: [id number] DOB: 09/21/1996 Today's Date: 10/08/2019    History of present illness 23 y.o.femalewithno significant past medical history presents to the ER with complaint of worsening shortness of breath and diarrhea. On November 5 she went to urgent care and was diagnosed with Covid 19. She presented to the ER on 10/04/2019 with cough and mild shortness of breath was found to have COVID-19 pneumonia and admitted.   OT comments  Pt making good progress towards OT goals. Pt performing multiple laps in room without AD at supervision - mod independent level, no LOB noted. Pt on RA with O2 >92% throughout. Further reviewed energy conservation strategies and activity progression after return home. Will continue per POC at this time.   Follow Up Recommendations  No OT follow up;Supervision - Intermittent    Equipment Recommendations  None recommended by OT          Precautions / Restrictions Precautions Precautions: Other (comment) Precaution Comments: monitor sats Restrictions Weight Bearing Restrictions: No       Mobility Bed Mobility               General bed mobility comments: Pt up in recliner  Transfers Overall transfer level: Modified independent Equipment used: None                  Balance Overall balance assessment: No apparent balance deficits (not formally assessed)                                         ADL either performed or assessed with clinical judgement   ADL Overall ADL's : Needs assistance/impaired                                   Tub/Shower Transfer Details (indicate cue type and reason): discussed use of shower chair for energy conservation at home, pt verbalizing understanding Functional mobility during ADLs: Supervision/safety;Modified independent General ADL Comments: pt peforming functional mobility in room (x2 laps from  recliner to door and back), pt only with mild SOB. issued EC handout and further reviewed                        Cognition Arousal/Alertness: Awake/alert Behavior During Therapy: WFL for tasks assessed/performed Overall Cognitive Status: Within Functional Limits for tasks assessed                                          Exercises     Shoulder Instructions       General Comments      Pertinent Vitals/ Pain       Pain Assessment: No/denies pain  Home Living                                          Prior Functioning/Environment              Frequency  Min 3X/week        Progress Toward Goals  OT Goals(current goals can now be found in the care plan section)  Progress towards OT goals:  Progressing toward goals  Acute Rehab OT Goals Patient Stated Goal: return home OT Goal Formulation: With patient Time For Goal Achievement: 10/20/19 Potential to Achieve Goals: Good  Plan Discharge plan remains appropriate    Co-evaluation                 AM-PAC OT "6 Clicks" Daily Activity     Outcome Measure   Help from another person eating meals?: None Help from another person taking care of personal grooming?: None Help from another person toileting, which includes using toliet, bedpan, or urinal?: None Help from another person bathing (including washing, rinsing, drying)?: A Little Help from another person to put on and taking off regular upper body clothing?: None Help from another person to put on and taking off regular lower body clothing?: A Little 6 Click Score: 22    End of Session    OT Visit Diagnosis: Muscle weakness (generalized) (M62.81);Other (comment)(decreased activity tolerance)   Activity Tolerance Patient tolerated treatment well   Patient Left in chair;with call bell/phone within reach   Nurse Communication Mobility status        Time: 4401-0272 OT Time Calculation (min): 24  min  Charges: OT General Charges $OT Visit: 1 Visit OT Treatments $Self Care/Home Management : 23-37 mins  Ruth Martinez, OT Supplemental Rehabilitation Services Pager [phone] Office [phone]    Ruth Martinez 10/08/2019, 4:20 PM

## 2019-10-08 NOTE — Progress Notes (Signed)
                                  PROGRESS NOTE                                                                                                                                                                                                             Patient Demographics:    Ruth Martinez, is a 23 y.o. female, DOB - 05/10/1996, MRN:[id number]  Outpatient Primary MD for the patient is System, Provider Not In    LOS - 4  Admit date - 10/04/2019    Chief Complaint  Patient presents with  . COVID       Brief Narrative  Ruth Martinez is a 23 y.o. female with no significant past medical history presents to the ER with complaint of worsening shortness of breath and diarrhea.  Patient states around 2 weeks ago had gone to [place] to admit waiting 2 days later she returned back to [place] and started having headache and fever chills.  On November 5 she went to urgent care and was diagnosed with Covid 19.    She presented to the ER on 10/04/2019 with cough and mild shortness of breath was found to have COVID-19 pneumonia and admitted.   Subjective:   Patient in bed, appears comfortable, denies any headache, no fever, no chest pain or pressure, no shortness of breath , no abdominal pain. No focal weakness.    Assessment  & Plan :     1. Acute Covid 19 Viral Pneumonitis during the ongoing 2020 Covid 19 Pandemic - she had moderate disease was treated with IV steroids and remdesivir, now on room air and symptom-free, finish her remdesivir course and discharged home.  Encouraged her to sit up in chair for pulmonary toiletry and use I-S and flutter valve in daytime.  Prone at night in bed.    COVID-19 Labs Taper steroids. Recent Labs    10/06/19 0330 10/07/19 0417 10/08/19 0035  DDIMER 0.36 0.43 0.38  CRP 11.1* 5.5* 2.7*    Lab Results  Component Value Date   SARSCOV2NAA POSITIVE (A) 10/04/2019   SARSCOV2NAA Not Detected 06/05/2019     Hepatic Function Latest Ref Rng & Units 10/08/2019 10/07/2019 10/06/2019  Total Protein 6.5 - 8.1 g/dL 7.6 8.1 8.1  Albumin 3.5 - 5.0 g/dL 3.2(L) 3.3(L) 3.3(L)  AST 15 - 41 U/L 23 18 20  ALT 0 - 44 U/L 23 19   20  Alk Phosphatase 38 - 126 U/L 40 41 38  Total Bilirubin 0.3 - 1.2 mg/dL 0.6 0.4 0.6     No results found for: BNP  2.  Morbid obesity.  BMI of 58.  Follow with PCP for weight loss.  3.  19 gastroenteritis.  Much improved and almost resolved.     Condition - Fair  Family Communication  :  None  Code Status :  Full  Diet :   Diet Order            Diet regular Room service appropriate? Yes; Fluid consistency: Thin  Diet effective now               Disposition Plan  :  Home  Consults  :  NONE  Procedures  :   PUD Prophylaxis :   DVT Prophylaxis  :  Lovenox   Lab Results  Component Value Date   PLT 518 (H) 10/08/2019    Inpatient Medications  Scheduled Meds: . dexamethasone (DECADRON) injection  6 mg Intravenous Q24H  . enoxaparin (LOVENOX) injection  0.5 mg/kg Subcutaneous Q24H  . mouth rinse  15 mL Mouth Rinse BID  . sodium chloride flush  3 mL Intravenous Once   Continuous Infusions: . remdesivir 100 mg in NS 250 mL 100 mg (10/08/19 0921)   PRN Meds:.acetaminophen **OR** [DISCONTINUED] acetaminophen, albuterol, [DISCONTINUED] ondansetron **OR** ondansetron (ZOFRAN) IV  Antibiotics  :    Anti-infectives (From admission, onward)   Start     Dose/Rate Route Frequency Ordered Stop   10/06/19 1000  remdesivir 100 mg in sodium chloride 0.9 % 250 mL IVPB     100 mg 500 mL/hr over 30 Minutes Intravenous Every 24 hours 10/05/19 0902 10/10/19 0959   10/06/19 0730  cefTRIAXone (ROCEPHIN) 1 g in sodium chloride 0.9 % 100 mL IVPB     1 g 200 mL/hr over 30 Minutes Intravenous Every 24 hours 10/06/19 0710 10/08/19 0656   10/05/19 0930  remdesivir 200 mg in sodium chloride 0.9 % 250 mL IVPB     200 mg 500 mL/hr over 30 Minutes Intravenous Once 10/05/19  0902 10/05/19 1500   10/04/19 2245  cefTRIAXone (ROCEPHIN) 2 g in sodium chloride 0.9 % 100 mL IVPB  Status:  Discontinued     2 g 200 mL/hr over 30 Minutes Intravenous Every 24 hours 10/04/19 2231 10/05/19 1336   10/04/19 2245  azithromycin (ZITHROMAX) 500 mg in sodium chloride 0.9 % 250 mL IVPB  Status:  Discontinued     500 mg 250 mL/hr over 60 Minutes Intravenous Every 24 hours 10/04/19 2231 10/05/19 1336       Time Spent in minutes  30   Prashant Singh M.D on 10/08/2019 at 11:03 AM  To page go to [email or web address] - password [email or web address]  [facility] -  Office  [phone]    See all Orders from today for further details    Objective:   Vitals:   10/07/19 1617 10/07/19 2000 10/08/19 0400 10/08/19 0742  BP: (!) 141/80 (!) 142/92 128/87 133/77  Pulse: 99 65  76  Resp: 16 16  18  Temp: 98.5 F (36.9 C) 98.6 F (37 C) 98.5 F (36.9 C) 98.2 F (36.8 C)  TempSrc: Oral Oral Oral Oral  SpO2: 96% 97% 97% 97%  Weight:      Height:        Wt Readings from Last 3 Encounters:  10/06/19 (!) 178.6 kg     Intake/Output Summary (Last   24 hours) at 10/08/2019 1103 Last data filed at 10/07/2019 1822 Gross per 24 hour  Intake 120 ml  Output -  Net 120 ml     Physical Exam  Awake Alert, Oriented X 3, No new F.N deficits, Normal affect .AT,PERRAL Supple Neck,No JVD, No cervical lymphadenopathy appriciated.  Symmetrical Chest wall movement, Good air movement bilaterally, CTAB RRR,No Gallops, Rubs or new Murmurs, No Parasternal Heave +ve B.Sounds, Abd Soft, No tenderness, No organomegaly appriciated, No rebound - guarding or rigidity. No Cyanosis, Clubbing or edema, No new Rash or bruise   Data Review:    CBC Recent Labs  Lab 10/04/19 2125 10/04/19 2349 10/05/19 0451 10/06/19 0330 10/07/19 0417 10/08/19 0035  WBC 7.1 7.4 6.9 9.2 9.9 9.0  HGB 12.4 12.4 11.7* 12.0 12.1 12.1  HCT 39.5 38.7 36.1 38.5 37.7 38.8  PLT 391 357 362 440* 515* 518*  MCV 94.5 93.0 92.3  94.6 92.9 95.1  MCH 29.7 29.8 29.9 29.5 29.8 29.7  MCHC 31.4 32.0 32.4 31.2 32.1 31.2  RDW 12.9 12.8 12.9 12.8 12.7 12.8  LYMPHSABS 1.4  --   --  1.5 1.3 1.8  MONOABS 0.9  --   --  0.9 0.6 1.0  EOSABS 0.0  --   --  0.0 0.0 0.0  BASOSABS 0.0  --   --  0.0 0.0 0.0    Chemistries  Recent Labs  Lab 10/04/19 2125 10/04/19 2349 10/05/19 0451 10/06/19 0330 10/07/19 0417 10/08/19 0035  NA 135  --  137 138 141 139  K 3.2*  --  3.5 4.0 3.9 3.8  CL 98  --  102 103 106 103  CO2 24  --  23 25 22 24  GLUCOSE 109*  --  165* 141* 153* 83  BUN 7  --  <5* 11 11 11  CREATININE 0.77 0.70 0.73 0.79 0.53 0.67  CALCIUM 8.3*  --  8.3* 8.7* 8.6* 8.4*  MG  --   --   --  2.2 2.2 2.1  AST 29  --   --  20 18 23  ALT 23  --   --  20 19 23  ALKPHOS 38  --   --  38 41 40  BILITOT 0.5  --   --  0.6 0.4 0.6   ------------------------------------------------------------------------------------------------------------------ No results for input(s): CHOL, HDL, LDLCALC, TRIG, CHOLHDL, LDLDIRECT in the last 72 hours.  No results found for: HGBA1C ------------------------------------------------------------------------------------------------------------------ No results for input(s): TSH, T4TOTAL, T3FREE, THYROIDAB in the last 72 hours.  Invalid input(s): FREET3  Cardiac Enzymes No results for input(s): CKMB, TROPONINI, MYOGLOBIN in the last 168 hours.  Invalid input(s): CK ------------------------------------------------------------------------------------------------------------------ No results found for: BNP  Micro Results Recent Results (from the past 240 hour(s))  SARS CORONAVIRUS 2 (TAT 6-24 HRS) Nasopharyngeal Nasopharyngeal Swab     Status: Abnormal   Collection Time: 10/04/19 10:26 PM   Specimen: Nasopharyngeal Swab  Result Value Ref Range Status   SARS Coronavirus 2 POSITIVE (A) NEGATIVE Final    Comment: RESULT CALLED TO, READ BACK BY AND VERIFIED WITH: BRITTANY BECK RN.@0724 ON  11.14.2020 BY TCALDWELL MT. (NOTE) SARS-CoV-2 target nucleic acids are DETECTED. The SARS-CoV-2 RNA is generally detectable in upper and lower respiratory specimens during the acute phase of infection. Positive results are indicative of active infection with SARS-CoV-2. Clinical  correlation with patient history and other diagnostic information is necessary to determine patient infection status. Positive results do  not rule out bacterial infection or co-infection with other   viruses. The expected result is Negative. Fact Sheet for Patients: SugarRoll.be Fact Sheet for Healthcare Providers: https://www.woods-mathews.com/ This test is not yet approved or cleared by the [place] FDA and  has been authorized for detection and/or diagnosis of SARS-CoV-2 by FDA under an Emergency Use Authorization (EUA). This EUA will remain  in effect (meaning this test  can be used) for the duration of the COVID-19 declaration under Section 564(b)(1) of the Act, 21 U.S.C. section 360bbb-3(b)(1), unless the authorization is terminated or revoked sooner. Performed at [facility], [place] [address] [place], [place] [address]   Blood Culture (routine x 2)     Status: None (Preliminary result)   Collection Time: 10/04/19 10:50 PM   Specimen: BLOOD RIGHT ARM  Result Value Ref Range Status   Specimen Description BLOOD RIGHT ARM  Final   Special Requests   Final    BOTTLES DRAWN AEROBIC AND ANAEROBIC Blood Culture adequate volume   Culture   Final    NO GROWTH 4 DAYS Performed at [facility], [place] [address], [place], [place] [address]    Report Status PENDING  Incomplete  Blood Culture (routine x 2)     Status: None (Preliminary result)   Collection Time: 10/04/19 11:01 PM   Specimen: BLOOD RIGHT HAND  Result Value Ref Range Status   Specimen Description BLOOD RIGHT HAND  Final   Special Requests   Final    BOTTLES DRAWN AEROBIC AND ANAEROBIC  Blood Culture adequate volume   Culture   Final    NO GROWTH 4 DAYS Performed at [facility], [place] [address], [place], [place] [address]    Report Status PENDING  Incomplete    Radiology Reports Dg Chest Portable 1 View  Result Date: 10/04/2019 CLINICAL DATA:  COVID, shortness of breath EXAM: PORTABLE CHEST 1 VIEW COMPARISON:  None. FINDINGS: Heart is normal size. Bilateral hazy airspace opacities in the mid and lower lungs, left greater than right. Low lung volumes. No effusions. No acute bony abnormality. IMPRESSION: Patchy bilateral airspace opacities, left greater than right. Low lung volumes. Electronically Signed   By: Rolm Baptise M.D.   On: 10/04/2019 22:26

## 2019-10-09 LAB — CBC WITH DIFFERENTIAL/PLATELET
Abs Immature Granulocytes: 0.12 10*3/uL — ABNORMAL HIGH (ref 0.00–0.07)
Basophils Absolute: 0 10*3/uL (ref 0.0–0.1)
Basophils Relative: 0 %
Eosinophils Absolute: 0 10*3/uL (ref 0.0–0.5)
Eosinophils Relative: 0 %
HCT: 37.7 % (ref 36.0–46.0)
Hemoglobin: 12 g/dL (ref 12.0–15.0)
Immature Granulocytes: 1 %
Lymphocytes Relative: 16 %
Lymphs Abs: 1.6 10*3/uL (ref 0.7–4.0)
MCH: 29.9 pg (ref 26.0–34.0)
MCHC: 31.8 g/dL (ref 30.0–36.0)
MCV: 93.8 fL (ref 80.0–100.0)
Monocytes Absolute: 1 10*3/uL (ref 0.1–1.0)
Monocytes Relative: 10 %
Neutro Abs: 7.1 10*3/uL (ref 1.7–7.7)
Neutrophils Relative %: 73 %
Platelets: 550 10*3/uL — ABNORMAL HIGH (ref 150–400)
RBC: 4.02 MIL/uL (ref 3.87–5.11)
RDW: 12.7 % (ref 11.5–15.5)
WBC: 9.8 10*3/uL (ref 4.0–10.5)
nRBC: 0 % (ref 0.0–0.2)

## 2019-10-09 LAB — COMPREHENSIVE METABOLIC PANEL
ALT: 21 U/L (ref 0–44)
AST: 16 U/L (ref 15–41)
Albumin: 3 g/dL — ABNORMAL LOW (ref 3.5–5.0)
Alkaline Phosphatase: 39 U/L (ref 38–126)
Anion gap: 12 (ref 5–15)
BUN: 12 mg/dL (ref 6–20)
CO2: 24 mmol/L (ref 22–32)
Calcium: 8.7 mg/dL — ABNORMAL LOW (ref 8.9–10.3)
Chloride: 102 mmol/L (ref 98–111)
Creatinine, Ser: 0.75 mg/dL (ref 0.44–1.00)
GFR calc Af Amer: >60 mL/min (ref >60)
GFR calc non Af Amer: >60 mL/min (ref >60)
Glucose, Bld: 153 mg/dL — ABNORMAL HIGH (ref 70–99)
Potassium: 4 mmol/L (ref 3.5–5.1)
Sodium: 138 mmol/L (ref 135–145)
Total Bilirubin: 0.4 mg/dL (ref 0.3–1.2)
Total Protein: 7.5 g/dL (ref 6.5–8.1)

## 2019-10-09 LAB — CULTURE, BLOOD (ROUTINE X 2)
Culture: NO GROWTH
Culture: NO GROWTH
Special Requests: ADEQUATE
Special Requests: ADEQUATE

## 2019-10-09 MED ORDER — INFLUENZA VAC SPLIT QUAD 0.5 ML IM SUSY
0.5000 mL | PREFILLED_SYRINGE | INTRAMUSCULAR | Status: AC
  Administered 2019-10-09: 0.5 mL via INTRAMUSCULAR
  Filled 2019-10-09 (×2): qty 0.5

## 2019-10-09 MED ORDER — INFLUENZA VAC SPLIT QUAD 0.5 ML IM SUSY: 0.5000 mL | PREFILLED_SYRINGE | INTRAMUSCULAR | Status: DC

## 2019-10-09 NOTE — Discharge Summary (Signed)
Physician Discharge Summary  Ruth Martinez ZOX:[id number] DOB: [birth year] DOA: 10/04/2019  PCP: System, Provider Not In  Admit date: 10/04/2019 Discharge date: 10/09/2019  Admitted From: home Disposition:  Home  Recommendations for Outpatient Follow-up:  1. Follow up with PCP in 1-2 weeks 2. Please obtain BMP/CBC in one week   Home Health:No Equipment/Devices:None  Discharge Condition:Stable CODE STATUS:Full Diet recommendation: Heart Healthy  Brief/Interim Summary: 23 y.o. female with no significant past medical history comes to the ED complaining of shortness of breath and diarrhea she really started about 2 weeks ago.  On 09/26/2019 she was diagnosed with COVID-19 she presents to the ER on 10/21/2019 with cough mild shortness of breath and was found to have COVID-19 pneumonia.  Discharge Diagnoses:  Principal Problem:   Acute respiratory failure with hypoxemia (HCC) Active Problems:   Viral pneumonia   Acute respiratory failure with hypoxia (HCC)   Pneumonia due to COVID-19 virus   Sepsis (HCC)  Acute respiratory failure with hypoxia due to COVID-19 viral pneumonitis: On admission she was in moderate distress requiring oxygen temporarily she was started on IV remdesivir and steroids she completed her course in-house, we were able to wean her to room air, her saturations have remained greater than 94% on room air.  Morbid obesity: Counseled she is being advised to follow-up with PCP.  COVID-19 gastroenteritis: Now resolved.   Discharge Instructions  Discharge Instructions    Diet - low sodium heart healthy   Complete by: As directed    Increase activity slowly   Complete by: As directed      Allergies as of 10/09/2019   No Known Allergies     Medication List    TAKE these medications   albuterol 108 (90 Base) MCG/ACT inhaler Commonly known as: VENTOLIN HFA Inhale 2 puffs into the lungs every 4 (four) hours as needed for wheezing or shortness of  breath.   guaiFENesin 600 MG 12 hr tablet Commonly known as: MUCINEX Take 600 mg by mouth 2 (two) times daily.       No Known Allergies  Consultations:  None   Procedures/Studies: Dg Chest Portable 1 View  Result Date: 10/04/2019 CLINICAL DATA:  COVID, shortness of breath EXAM: PORTABLE CHEST 1 VIEW COMPARISON:  None. FINDINGS: Heart is normal size. Bilateral hazy airspace opacities in the mid and lower lungs, left greater than right. Low lung volumes. No effusions. No acute bony abnormality. IMPRESSION: Patchy bilateral airspace opacities, left greater than right. Low lung volumes. Electronically Signed   By: Charlett Nose M.D.   On: 10/04/2019 22:26    Subjective: No complaints.  Discharge Exam: Vitals:   10/09/19 0438 10/09/19 0731  BP: 134/78 123/82  Pulse: 80 65  Resp: 18 18  Temp: 98.2 F (36.8 C) 97.7 F (36.5 C)  SpO2: 97% 99%   Vitals:   10/08/19 1604 10/08/19 2000 10/09/19 0438 10/09/19 0731  BP: 122/77 129/79 134/78 123/82  Pulse: 65 69 80 65  Resp: Temp: 98.5 F (36.9 C) 98.1 F (36.7 C) 98.2 F (36.8 C) 97.7 F (36.5 C)  TempSrc: Oral Oral Oral Axillary  SpO2: 98% 97% 97% 99%  Weight:      Height:        General: Pt is alert, awake, not in acute distress Cardiovascular: RRR, S1/S2 +, no rubs, no gallops Respiratory: CTA bilaterally, no wheezing, no rhonchi Abdominal: Soft, NT, ND, bowel sounds + Extremities: no edema, no cyanosis    The results of  significant diagnostics from this hospitalization (including imaging, microbiology, ancillary and laboratory) are listed below for reference.     Microbiology: Recent Results (from the past 240 hour(s))  SARS CORONAVIRUS 2 (TAT 6-24 HRS) Nasopharyngeal Nasopharyngeal Swab     Status: Abnormal   Collection Time: 10/04/19 10:26 PM   Specimen: Nasopharyngeal Swab  Result Value Ref Range Status   SARS Coronavirus 2 POSITIVE (A) NEGATIVE Final    Comment: RESULT CALLED TO, READ BACK  BY AND VERIFIED WITH: BRITTANY BECK RN.@0724  ON 11.14.2020 BY TCALDWELL MT. (NOTE) SARS-CoV-2 target nucleic acids are DETECTED. The SARS-CoV-2 RNA is generally detectable in upper and lower respiratory specimens during the acute phase of infection. Positive results are indicative of active infection with SARS-CoV-2. Clinical  correlation with patient history and other diagnostic information is necessary to determine patient infection status. Positive results do  not rule out bacterial infection or co-infection with other viruses. The expected result is Negative. Fact Sheet for Patients: SugarRoll.be Fact Sheet for Healthcare Providers: https://www.woods-mathews.com/ This test is not yet approved or cleared by the [place] FDA and  has been authorized for detection and/or diagnosis of SARS-CoV-2 by FDA under an Emergency Use Authorization (EUA). This EUA will remain  in effect (meaning this test  can be used) for the duration of the COVID-19 declaration under Section 564(b)(1) of the Act, 21 U.S.C. section 360bbb-3(b)(1), unless the authorization is terminated or revoked sooner. Performed at [facility], [place] [address], [place], [place] [address]   Blood Culture (routine x 2)     Status: None (Preliminary result)   Collection Time: 10/04/19 10:50 PM   Specimen: BLOOD RIGHT ARM  Result Value Ref Range Status   Specimen Description BLOOD RIGHT ARM  Final   Special Requests   Final    BOTTLES DRAWN AEROBIC AND ANAEROBIC Blood Culture adequate volume   Culture   Final    NO GROWTH 4 DAYS Performed at [facility], [place] [address] [place], [place] [address]    Report Status PENDING  Incomplete  Blood Culture (routine x 2)     Status: None (Preliminary result)   Collection Time: 10/04/19 11:01 PM   Specimen: BLOOD RIGHT HAND  Result Value Ref Range Status   Specimen Description BLOOD RIGHT HAND  Final   Special Requests    Final    BOTTLES DRAWN AEROBIC AND ANAEROBIC Blood Culture adequate volume   Culture   Final    NO GROWTH 4 DAYS Performed at [facility], [place] [address], [place], [place] [address]    Report Status PENDING  Incomplete     Labs: BNP (last 3 results) No results for input(s): BNP in the last 8760 hours. Basic Metabolic Panel: Recent Labs  Lab 10/05/19 0451 10/06/19 0330 10/07/19 0417 10/08/19 0035 10/09/19 0105  NA 137 138 141 139 138  K 3.5 4.0 3.9 3.8 4.0  CL 102 103 106 103 102  CO2 23 25 22 24 24   GLUCOSE 165* 141* 153* 83 153*  BUN <5* 11 11 11 12   CREATININE 0.73 0.79 0.53 0.67 0.75  CALCIUM 8.3* 8.7* 8.6* 8.4* 8.7*  MG  --  2.2 2.2 2.1  --    Liver Function Tests: Recent Labs  Lab 10/04/19 2125 10/06/19 0330 10/07/19 0417 10/08/19 0035 10/09/19 0105  AST 29 20 18 23 16   ALT 23 20 19 23 21   ALKPHOS 38 38 41 40 39  BILITOT 0.5 0.6 0.4 0.6 0.4  PROT 7.9 8.1 8.1 7.6 7.5  ALBUMIN 3.0* 3.3* 3.3* 3.2* 3.0*   No results for input(s): LIPASE, AMYLASE in the last 168 hours. No results for input(s): AMMONIA in the last 168 hours. CBC: Recent Labs  Lab 10/04/19 2125  10/05/19 0451 10/06/19 0330 10/07/19 0417 10/08/19 0035 10/09/19 0105  WBC 7.1   < > 6.9 9.2 9.9 9.0 9.8  NEUTROABS 4.6  --   --  6.7 7.9* 6.1 7.1  HGB 12.4   < > 11.7* 12.0 12.1 12.1 12.0  HCT 39.5   < > 36.1 38.5 37.7 38.8 37.7  MCV 94.5   < > 92.3 94.6 92.9 95.1 93.8  PLT 391   < > 362 440* 515* 518* 550*   < > = values in this interval not displayed.   Cardiac Enzymes: No results for input(s): CKTOTAL, CKMB, CKMBINDEX, TROPONINI in the last 168 hours. BNP: Invalid input(s): POCBNP CBG: No results for input(s): GLUCAP in the last 168 hours. D-Dimer Recent Labs    10/07/19 0417 10/08/19 0035  DDIMER 0.43 0.38   Hgb A1c No results for input(s): HGBA1C in the last 72 hours. Lipid Profile No results for input(s): CHOL, HDL, LDLCALC, TRIG, CHOLHDL, LDLDIRECT in the last 72  hours. Thyroid function studies No results for input(s): TSH, T4TOTAL, T3FREE, THYROIDAB in the last 72 hours.  Invalid input(s): FREET3 Anemia work up No results for input(s): VITAMINB12, FOLATE, FERRITIN, TIBC, IRON, RETICCTPCT in the last 72 hours. Urinalysis    Component Value Date/Time   COLORURINE YELLOW 10/04/2019 2223   APPEARANCEUR HAZY (A) 10/04/2019 2223   LABSPEC 1.027 10/04/2019 2223   PHURINE 6.0 10/04/2019 2223   GLUCOSEU NEGATIVE 10/04/2019 2223   HGBUR LARGE (A) 10/04/2019 2223   BILIRUBINUR NEGATIVE 10/04/2019 2223   KETONESUR NEGATIVE 10/04/2019 2223   PROTEINUR 100 (A) 10/04/2019 2223   NITRITE NEGATIVE 10/04/2019 2223   LEUKOCYTESUR SMALL (A) 10/04/2019 2223   Sepsis Labs Invalid input(s): PROCALCITONIN,  WBC,  LACTICIDVEN Microbiology Recent Results (from the past 240 hour(s))  SARS CORONAVIRUS 2 (TAT 6-24 HRS) Nasopharyngeal Nasopharyngeal Swab     Status: Abnormal   Collection Time: 10/04/19 10:26 PM   Specimen: Nasopharyngeal Swab  Result Value Ref Range Status   SARS Coronavirus 2 POSITIVE (A) NEGATIVE Final    Comment: RESULT CALLED TO, READ BACK BY AND VERIFIED WITH: BRITTANY BECK RN.@0724  ON 11.14.2020 BY TCALDWELL MT. (NOTE) SARS-CoV-2 target nucleic acids are DETECTED. The SARS-CoV-2 RNA is generally detectable in upper and lower respiratory specimens during the acute phase of infection. Positive results are indicative of active infection with SARS-CoV-2. Clinical  correlation with patient history and other diagnostic information is necessary to determine patient infection status. Positive results do  not rule out bacterial infection or co-infection with other viruses. The expected result is Negative. Fact Sheet for Patients: 11.16.2020 Fact Sheet for Healthcare Providers: HairSlick.no This test is not yet approved or cleared by the [place] FDA and  has been authorized for  detection and/or diagnosis of SARS-CoV-2 by FDA under an Emergency Use Authorization (EUA). This EUA will remain  in effect (meaning this test  can be used) for the duration of the COVID-19 declaration under Section 564(b)(1) of the Act, 21 U.S.C. section 360bbb-3(b)(1), unless the authorization is terminated or revoked sooner. Performed at [facility], [address], [place], [place]   Blood Culture (routine x 2)     Status: None (Preliminary result)   Collection Time: 10/04/19  10:50 PM   Specimen: BLOOD RIGHT ARM  Result Value Ref Range Status   Specimen Description BLOOD RIGHT ARM  Final   Special Requests   Final    BOTTLES DRAWN AEROBIC AND ANAEROBIC Blood Culture adequate volume   Culture   Final    NO GROWTH 4 DAYS Performed at [facility], [address] [place], [place] [address]    Report Status PENDING  Incomplete  Blood Culture (routine x 2)     Status: None (Preliminary result)   Collection Time: 10/04/19 11:01 PM   Specimen: BLOOD RIGHT HAND  Result Value Ref Range Status   Specimen Description BLOOD RIGHT HAND  Final   Special Requests   Final    BOTTLES DRAWN AEROBIC AND ANAEROBIC Blood Culture adequate volume   Culture   Final    NO GROWTH 4 DAYS Performed at [facility], [address] [place], [place] [address]    Report Status PENDING  Incomplete     Time coordinating discharge: Over 30 minutes  SIGNED:   Marinda ElkAbraham Feliz Ortiz, MD  [facility] 10/09/2019, 11:05 AM Pager   If 7PM-7AM, please contact night-coverage [email or web address] Password [email or web address]

## 2020-07-24 ENCOUNTER — Other Ambulatory Visit: Payer: Self-pay

## 2020-08-05 ENCOUNTER — Other Ambulatory Visit: Payer: Self-pay | Admitting: Obstetrics and Gynecology

## 2020-08-05 DIAGNOSIS — Z3201 Encounter for pregnancy test, result positive: Secondary | ICD-10-CM

## 2020-08-13 ENCOUNTER — Other Ambulatory Visit: Payer: Self-pay

## 2020-08-13 ENCOUNTER — Ambulatory Visit
Admission: RE | Admit: 2020-08-13 | Discharge: 2020-08-13 | Disposition: A | Discharge status: Home / Self Care | Payer: BC Managed Care – PPO | Source: Ambulatory Visit | Attending: Obstetrics and Gynecology | Admitting: Obstetrics and Gynecology

## 2020-08-13 DIAGNOSIS — Z3201 Encounter for pregnancy test, result positive: Secondary | ICD-10-CM | POA: Insufficient documentation

## 2020-08-17 ENCOUNTER — Other Ambulatory Visit: Payer: Self-pay | Admitting: Obstetrics and Gynecology

## 2020-08-17 DIAGNOSIS — O3680X0 Pregnancy with inconclusive fetal viability, not applicable or unspecified: Secondary | ICD-10-CM

## 2020-08-27 ENCOUNTER — Ambulatory Visit: Payer: BC Managed Care – PPO

## 2020-09-01 DIAGNOSIS — Z20822 Contact with and (suspected) exposure to covid-19: Secondary | ICD-10-CM | POA: Diagnosis not present

## 2020-09-04 DIAGNOSIS — Z01419 Encounter for gynecological examination (general) (routine) without abnormal findings: Secondary | ICD-10-CM | POA: Diagnosis not present

## 2020-09-04 DIAGNOSIS — Z124 Encounter for screening for malignant neoplasm of cervix: Secondary | ICD-10-CM | POA: Diagnosis not present

## 2020-09-15 DIAGNOSIS — Z1329 Encounter for screening for other suspected endocrine disorder: Secondary | ICD-10-CM | POA: Diagnosis not present

## 2020-09-15 DIAGNOSIS — Z13228 Encounter for screening for other metabolic disorders: Secondary | ICD-10-CM | POA: Diagnosis not present

## 2020-09-15 DIAGNOSIS — Z13220 Encounter for screening for lipoid disorders: Secondary | ICD-10-CM | POA: Diagnosis not present

## 2020-09-15 DIAGNOSIS — Z131 Encounter for screening for diabetes mellitus: Secondary | ICD-10-CM | POA: Diagnosis not present

## 2020-09-18 DIAGNOSIS — E1165 Type 2 diabetes mellitus with hyperglycemia: Secondary | ICD-10-CM | POA: Diagnosis not present

## 2020-09-18 DIAGNOSIS — L680 Hirsutism: Secondary | ICD-10-CM | POA: Diagnosis not present

## 2020-09-24 DIAGNOSIS — E1165 Type 2 diabetes mellitus with hyperglycemia: Secondary | ICD-10-CM | POA: Diagnosis not present

## 2020-10-19 DIAGNOSIS — E1165 Type 2 diabetes mellitus with hyperglycemia: Secondary | ICD-10-CM | POA: Diagnosis not present

## 2020-10-19 DIAGNOSIS — L680 Hirsutism: Secondary | ICD-10-CM | POA: Diagnosis not present

## 2021-02-02 DIAGNOSIS — Z20822 Contact with and (suspected) exposure to covid-19: Secondary | ICD-10-CM | POA: Diagnosis not present

## 2021-02-09 DIAGNOSIS — E1165 Type 2 diabetes mellitus with hyperglycemia: Secondary | ICD-10-CM | POA: Diagnosis not present

## 2021-02-10 DIAGNOSIS — E1165 Type 2 diabetes mellitus with hyperglycemia: Secondary | ICD-10-CM | POA: Diagnosis not present

## 2021-03-26 DIAGNOSIS — E1165 Type 2 diabetes mellitus with hyperglycemia: Secondary | ICD-10-CM | POA: Diagnosis not present

## 2021-03-26 DIAGNOSIS — E7800 Pure hypercholesterolemia, unspecified: Secondary | ICD-10-CM | POA: Diagnosis not present

## 2021-03-26 DIAGNOSIS — L680 Hirsutism: Secondary | ICD-10-CM | POA: Diagnosis not present

## 2021-07-22 DIAGNOSIS — E1165 Type 2 diabetes mellitus with hyperglycemia: Secondary | ICD-10-CM | POA: Diagnosis not present

## 2021-07-22 DIAGNOSIS — E7800 Pure hypercholesterolemia, unspecified: Secondary | ICD-10-CM | POA: Diagnosis not present

## 2021-07-29 DIAGNOSIS — E7800 Pure hypercholesterolemia, unspecified: Secondary | ICD-10-CM | POA: Diagnosis not present

## 2021-07-29 DIAGNOSIS — E1165 Type 2 diabetes mellitus with hyperglycemia: Secondary | ICD-10-CM | POA: Diagnosis not present

## 2021-07-29 DIAGNOSIS — L680 Hirsutism: Secondary | ICD-10-CM | POA: Diagnosis not present

## 2021-07-29 DIAGNOSIS — I10 Essential (primary) hypertension: Secondary | ICD-10-CM | POA: Diagnosis not present

## 2021-08-04 DIAGNOSIS — Z20822 Contact with and (suspected) exposure to covid-19: Secondary | ICD-10-CM | POA: Diagnosis not present

## 2021-08-04 DIAGNOSIS — Z03818 Encounter for observation for suspected exposure to other biological agents ruled out: Secondary | ICD-10-CM | POA: Diagnosis not present

## 2021-08-19 DIAGNOSIS — I10 Essential (primary) hypertension: Secondary | ICD-10-CM | POA: Diagnosis not present

## 2021-08-19 DIAGNOSIS — R300 Dysuria: Secondary | ICD-10-CM | POA: Diagnosis not present

## 2021-08-19 DIAGNOSIS — E119 Type 2 diabetes mellitus without complications: Secondary | ICD-10-CM | POA: Diagnosis not present

## 2021-08-19 DIAGNOSIS — Z6841 Body Mass Index (BMI) 40.0 and over, adult: Secondary | ICD-10-CM | POA: Diagnosis not present

## 2021-08-19 DIAGNOSIS — Z30011 Encounter for initial prescription of contraceptive pills: Secondary | ICD-10-CM | POA: Diagnosis not present

## 2021-10-16 DIAGNOSIS — Z1339 Encounter for screening examination for other mental health and behavioral disorders: Secondary | ICD-10-CM | POA: Diagnosis not present

## 2021-10-16 DIAGNOSIS — N914 Secondary oligomenorrhea: Secondary | ICD-10-CM | POA: Diagnosis not present

## 2021-10-16 DIAGNOSIS — Z0000 Encounter for general adult medical examination without abnormal findings: Secondary | ICD-10-CM | POA: Diagnosis not present

## 2021-10-16 DIAGNOSIS — E119 Type 2 diabetes mellitus without complications: Secondary | ICD-10-CM | POA: Diagnosis not present

## 2021-10-16 DIAGNOSIS — E559 Vitamin D deficiency, unspecified: Secondary | ICD-10-CM | POA: Diagnosis not present

## 2021-10-16 DIAGNOSIS — Z1159 Encounter for screening for other viral diseases: Secondary | ICD-10-CM | POA: Diagnosis not present

## 2021-10-16 DIAGNOSIS — Z6841 Body Mass Index (BMI) 40.0 and over, adult: Secondary | ICD-10-CM | POA: Diagnosis not present

## 2021-10-16 DIAGNOSIS — I10 Essential (primary) hypertension: Secondary | ICD-10-CM | POA: Diagnosis not present

## 2021-10-16 DIAGNOSIS — Z1331 Encounter for screening for depression: Secondary | ICD-10-CM | POA: Diagnosis not present

## 2021-11-08 DIAGNOSIS — E119 Type 2 diabetes mellitus without complications: Secondary | ICD-10-CM | POA: Diagnosis not present

## 2021-11-08 DIAGNOSIS — Z308 Encounter for other contraceptive management: Secondary | ICD-10-CM | POA: Diagnosis not present

## 2021-11-08 DIAGNOSIS — Z6841 Body Mass Index (BMI) 40.0 and over, adult: Secondary | ICD-10-CM | POA: Diagnosis not present

## 2021-11-08 DIAGNOSIS — I10 Essential (primary) hypertension: Secondary | ICD-10-CM | POA: Diagnosis not present

## 2021-11-23 ENCOUNTER — Institutional Professional Consult (permissible substitution): Payer: BC Managed Care – PPO | Admitting: Pulmonary Disease

## 2021-12-05 ENCOUNTER — Ambulatory Visit (INDEPENDENT_AMBULATORY_CARE_PROVIDER_SITE_OTHER): Payer: BC Managed Care – PPO

## 2021-12-05 ENCOUNTER — Other Ambulatory Visit: Payer: Self-pay

## 2021-12-05 ENCOUNTER — Ambulatory Visit (HOSPITAL_COMMUNITY)
Admission: EM | Admit: 2021-12-05 | Discharge: 2021-12-05 | Disposition: A | Discharge status: Home / Self Care | Payer: BC Managed Care – PPO | Attending: Physician Assistant | Admitting: Physician Assistant

## 2021-12-05 ENCOUNTER — Encounter (HOSPITAL_COMMUNITY): Payer: Self-pay

## 2021-12-05 DIAGNOSIS — R319 Hematuria, unspecified: Secondary | ICD-10-CM | POA: Diagnosis not present

## 2021-12-05 DIAGNOSIS — R1011 Right upper quadrant pain: Secondary | ICD-10-CM | POA: Diagnosis not present

## 2021-12-05 DIAGNOSIS — R109 Unspecified abdominal pain: Secondary | ICD-10-CM

## 2021-12-05 LAB — BASIC METABOLIC PANEL
Anion gap: 7 (ref 5–15)
BUN: 10 mg/dL (ref 6–20)
CO2: 25 mmol/L (ref 22–32)
Calcium: 8.6 mg/dL — ABNORMAL LOW (ref 8.9–10.3)
Chloride: 105 mmol/L (ref 98–111)
Creatinine, Ser: 0.74 mg/dL (ref 0.44–1.00)
GFR, Estimated: >60 mL/min (ref >60)
Glucose, Bld: 112 mg/dL — ABNORMAL HIGH (ref 70–99)
Potassium: 3.8 mmol/L (ref 3.5–5.1)
Sodium: 137 mmol/L (ref 135–145)

## 2021-12-05 LAB — CBC WITH DIFFERENTIAL/PLATELET
Abs Immature Granulocytes: 0.03 10*3/uL (ref 0.00–0.07)
Basophils Absolute: 0 10*3/uL (ref 0.0–0.1)
Basophils Relative: 0 %
Eosinophils Absolute: 0.1 10*3/uL (ref 0.0–0.5)
Eosinophils Relative: 1 %
HCT: 38.3 % (ref 36.0–46.0)
Hemoglobin: 12.5 g/dL (ref 12.0–15.0)
Immature Granulocytes: 0 %
Lymphocytes Relative: 20 %
Lymphs Abs: 1.7 10*3/uL (ref 0.7–4.0)
MCH: 29.8 pg (ref 26.0–34.0)
MCHC: 32.6 g/dL (ref 30.0–36.0)
MCV: 91.4 fL (ref 80.0–100.0)
Monocytes Absolute: 0.7 10*3/uL (ref 0.1–1.0)
Monocytes Relative: 8 %
Neutro Abs: 6 10*3/uL (ref 1.7–7.7)
Neutrophils Relative %: 71 %
Platelets: 411 10*3/uL — ABNORMAL HIGH (ref 150–400)
RBC: 4.19 MIL/uL (ref 3.87–5.11)
RDW: 12.9 % (ref 11.5–15.5)
WBC: 8.5 10*3/uL (ref 4.0–10.5)
nRBC: 0 % (ref 0.0–0.2)

## 2021-12-05 LAB — POCT URINALYSIS DIPSTICK, ED / UC
Bilirubin Urine: NEGATIVE
Glucose, UA: NEGATIVE mg/dL
Ketones, ur: NEGATIVE mg/dL
Leukocytes,Ua: NEGATIVE
Nitrite: NEGATIVE
Protein, ur: NEGATIVE mg/dL
Specific Gravity, Urine: 1.025 (ref 1.005–1.030)
Urobilinogen, UA: 0.2 mg/dL (ref 0.0–1.0)
pH: 6 (ref 5.0–8.0)

## 2021-12-05 LAB — POC URINE PREG, ED: Preg Test, Ur: NEGATIVE

## 2021-12-05 MED ORDER — TAMSULOSIN HCL 0.4 MG PO CAPS
0.4000 mg | ORAL_CAPSULE | Freq: Every day | ORAL | 0 refills | Status: AC
Start: 2021-12-05 — End: ?

## 2021-12-05 NOTE — ED Provider Notes (Signed)
[facility]    CSN: [id number] Arrival date & time: 12/05/21  1008      History   Chief Complaint Chief Complaint  Patient presents with   Back Pain    HPI Ruth Martinez is a 26 y.o. female.   Patient presents today with a several hour history of right-sided lower back pain/flank pain.  Reports pain woke her up at approximately 4:00 in the morning.  She initially thought symptoms were related to lower back pain and she tried to roll over but cannot fall back asleep.  She then thought pain might be related to gas or indigestion and so took Benefiber, back to sleep.  She did have a bowel movement earlier today this did not relieve symptoms.  She denies any urinary symptoms including hematuria, or dysuria.  She does report some frequency and urgency but states this is chronic related to her diabetes.  She reports nausea without vomiting.  Denies any unusual food intake.  She denies history of nephrolithiasis.  Reports pain is rated 6 but increases to 8, localized to right flank with radiation into her abdomen and chest, described as intermittent and sharp without identifiable trigger, no alleviating factors identified.  She has not tried any over-the-counter medication for symptom management.  She does still have her gallbladder and denies any previous abdominal surgeries.  She denies any fever, confusion, yellowing of skin/eyes, lightheadedness, weakness.   History reviewed. No pertinent past medical history.  Patient Active Problem List   Diagnosis Date Noted   Pneumonia due to COVID-19 virus 10/05/2019   Sepsis (HCC) 10/05/2019   Acute respiratory failure with hypoxemia (HCC) 10/04/2019   Viral pneumonia 10/04/2019   Acute respiratory failure with hypoxia (HCC) 10/04/2019    Past Surgical History:  Procedure Laterality Date   WISDOM TOOTH EXTRACTION      OB History   No obstetric history on file.      Home Medications    Prior to Admission medications    Medication Sig Start Date End Date Taking? Authorizing Provider  tamsulosin (FLOMAX) 0.4 MG CAPS capsule Take 1 capsule (0.4 mg total) by mouth daily. 12/05/21  Yes Adian Jablonowski K, PA-C  albuterol (VENTOLIN HFA) 108 (90 Base) MCG/ACT inhaler Inhale 2 puffs into the lungs every 4 (four) hours as needed for wheezing or shortness of breath.  09/26/19   [provider]  guaiFENesin (MUCINEX) 600 MG 12 hr tablet Take 600 mg by mouth 2 (two) times daily.     [provider]    Family History Family History  Problem Relation Age of Onset   Diabetes Mother     Social History Social History   Tobacco Use   Smoking status: Never   Smokeless tobacco: Never  Substance Use Topics   Alcohol use: Yes    Comment: Occasionally   Drug use: Never     Allergies   Patient has no known allergies.   Review of Systems Review of Systems  Constitutional:  Positive for activity change. Negative for appetite change, fatigue and fever.  Respiratory:  Positive for shortness of breath. Negative for cough.   Cardiovascular:  Negative for chest pain.  Gastrointestinal:  Positive for abdominal pain and nausea. Negative for diarrhea and vomiting.  Genitourinary:  Positive for flank pain, frequency (Chronic) and urgency (Chronic). Negative for dysuria, pelvic pain, vaginal bleeding, vaginal discharge and vaginal pain.  Musculoskeletal:  Positive for back pain. Negative for arthralgias and myalgias.  Neurological:  Negative for dizziness,  light-headedness and headaches.    Physical Exam Triage Vital Signs ED Triage Vitals  Enc Vitals Group     BP 12/05/21 1041 (!) 145/103     Pulse Rate 12/05/21 1041 87     Resp 12/05/21 1041 17     Temp 12/05/21 1041 98.8 F (37.1 C)     Temp Source 12/05/21 1041 Oral     SpO2 12/05/21 1041 96 %     Weight --      Height --      Head Circumference --      Peak Flow --      Pain Score 12/05/21 1039 6     Pain Loc --      Pain Edu? --      Excl.  in [place]? --    No data found.  Updated Vital Signs BP (!) 145/103 (BP Location: Right Arm)    Pulse 87    Temp 98.8 F (37.1 C) (Oral)    Resp 17    LMP 11/22/2020 (Exact Date)    SpO2 96%   Visual Acuity Right Eye Distance:   Left Eye Distance:   Bilateral Distance:    Right Eye Near:   Left Eye Near:    Bilateral Near:     Physical Exam Vitals reviewed.  Constitutional:      General: She is awake. She is not in acute distress.    Appearance: Normal appearance. She is well-developed. She is not ill-appearing.     Comments: Very pleasant female appears stated age in no acute distress sitting comfortably in exam room  HENT:     Head: Normocephalic and atraumatic.  Cardiovascular:     Rate and Rhythm: Normal rate and regular rhythm.     Heart sounds: Normal heart sounds, S1 normal and S2 normal. No murmur heard. Pulmonary:     Effort: Pulmonary effort is normal.     Breath sounds: Normal breath sounds. No wheezing, rhonchi or rales.     Comments: Clear to auscultation bilaterally Abdominal:     General: Bowel sounds are normal.     Palpations: Abdomen is soft.     Tenderness: There is abdominal tenderness in the right upper quadrant. There is right CVA tenderness. There is no left CVA tenderness, guarding or rebound. Negative signs include Murphy's sign.  Psychiatric:        Behavior: Behavior is cooperative.     UC Treatments / Results  Labs (all labs ordered are listed, but only abnormal results are displayed) Labs Reviewed  POCT URINALYSIS DIPSTICK, ED / UC - Abnormal; Notable for the following components:      Result Value   Hgb urine dipstick TRACE (*)    All other components within normal limits  URINE CULTURE  CBC WITH DIFFERENTIAL/PLATELET  BASIC METABOLIC PANEL  POC URINE PREG, ED    EKG   Radiology DG Abdomen 1 View  Result Date: 12/05/2021 CLINICAL DATA:  Right flank pain and hematuria. EXAM: ABDOMEN - 1 VIEW COMPARISON:  None. FINDINGS: The bowel  gas pattern is unremarkable. No radiopaque calculi are identified, although evaluation is suboptimal due to large habitus. IMPRESSION: Negative. No radiopaque calculi identified, although evaluation suboptimal due to large habitus. Electronically Signed   By: Marlaine Hind M.D.   On: 12/05/2021 11:19    Procedures Procedures (including critical care time)  Medications Ordered in UC Medications - No data to display  Initial Impression / Assessment and Plan / UC Course  I  have reviewed the triage vital signs and the nursing notes.  Pertinent labs & imaging results that were available during my care of the patient were reviewed by me and considered in my medical decision making (see chart for details).     Patient is afebrile, nontachycardic, well-appearing, nontoxic.  UA showed trace hemoglobin and KUB was obtained that showed no radiopaque calculus imaging was limited.  Discussed with patient that this makes a kidney stone less likely but does not rule it out.  Concern for nephrolithiasis versus cholelithiasis.  Discussed with refusing to do would be to go to the emergency room for imaging since we do not have the capabilities in urgent care, however, patient reports that symptoms are not severe so we will try to arrange outpatient follow-up first thing next week.  She was prescribed Flomax if this is a kidney stone to help with passage.  CBC and CMP were obtained and we discussed that if there are any significant abnormalities she would need to go to the ER.  She is currently followed by [facility] as a primary care doctor encouraged her to call and schedule an appointment with them first thing tomorrow they may be able to arrange outpatient follow-up/imaging.  She was also given contact information for GI specialist and urologist to schedule appointments.  Discussed at length that if she has any worsening symptoms including persistent severe pain, nausea/vomiting interfering with oral  intake, fever, yellowing of her eyes/skin, confusion, lightheadedness she needs to go directly to the emergency room.  Strict return precautions given to which she expressed understanding.  Final Clinical Impressions(s) / UC Diagnoses   Final diagnoses:  Right flank pain  RUQ pain     Discharge Instructions      I am concerned for an issue with your gallbladder or a kidney stone causing your symptoms.  We do not see a kidney stone on your imaging today.  This does not rule out a kidney stone but makes it less likely.  We are going to start Flomax to help pass the kidney stone.  Take this daily.  I will contact you with your lab work if anything is abnormal and we need to change your treatment plan.  Please make sure you rest and drink plenty of fluid.  Follow-up with your primary care provider first thing tomorrow to consider outpatient imaging.  I have also given you the contact information for urologist and GI specialist and you can contact them to schedule an appointment.  If you develop any worsening symptoms including fever, yellowing of your skin/eyes, confusion, lightheadedness, nausea/vomiting interfering with oral intake, increased pain you need to go directly to the emergency room as we discussed.     ED Prescriptions     Medication Sig Dispense Auth. Provider   tamsulosin (FLOMAX) 0.4 MG CAPS capsule Take 1 capsule (0.4 mg total) by mouth daily. 10 capsule Kymoni Lesperance, Derry Skill, PA-C      PDMP not reviewed this encounter.   Terrilee Croak, PA-C 12/05/21 1140

## 2021-12-05 NOTE — ED Triage Notes (Signed)
Pt states she took her BP morning.   States she forgot to take it last night.

## 2021-12-05 NOTE — Discharge Instructions (Signed)
I am concerned for an issue with your gallbladder or a kidney stone causing your symptoms.  We do not see a kidney stone on your imaging today.  This does not rule out a kidney stone but makes it less likely.  We are going to start Flomax to help pass the kidney stone.  Take this daily.  I will contact you with your lab work if anything is abnormal and we need to change your treatment plan.  Please make sure you rest and drink plenty of fluid.  Follow-up with your primary care provider first thing tomorrow to consider outpatient imaging.  I have also given you the contact information for urologist and GI specialist and you can contact them to schedule an appointment.  If you develop any worsening symptoms including fever, yellowing of your skin/eyes, confusion, lightheadedness, nausea/vomiting interfering with oral intake, increased pain you need to go directly to the emergency room as we discussed.

## 2021-12-05 NOTE — ED Triage Notes (Signed)
Pt states she woke up this morning have severe back pain.   States she thought she had gas and states she drank something to give her a bowel movement.   States she tightness and pain on the Rside and states she has difficulty breathing.

## 2021-12-06 LAB — URINE CULTURE: Culture: 30000 — AB

## 2021-12-07 DIAGNOSIS — E7800 Pure hypercholesterolemia, unspecified: Secondary | ICD-10-CM | POA: Diagnosis not present

## 2021-12-07 DIAGNOSIS — L680 Hirsutism: Secondary | ICD-10-CM | POA: Diagnosis not present

## 2021-12-07 DIAGNOSIS — I10 Essential (primary) hypertension: Secondary | ICD-10-CM | POA: Diagnosis not present

## 2021-12-07 DIAGNOSIS — E1165 Type 2 diabetes mellitus with hyperglycemia: Secondary | ICD-10-CM | POA: Diagnosis not present

## 2022-01-30 IMAGING — US US OB < 14 WEEKS - US OB TV
1 series · 15 of 28 positions shown · non-contrast
Comparison: None.

CLINICAL DATA: Dating

EXAM:
OBSTETRIC <14 WK US AND TRANSVAGINAL OB US
TECHNIQUE: Both transabdominal and transvaginal ultrasound examinations were
performed for complete evaluation of the gestation as well as the
maternal uterus, adnexal regions, and pelvic cul-de-sac.
Transvaginal technique was performed to assess early pregnancy.

[Series 1: us ob < 14 weeks - us ob tv · 15 of 95 slices shown]
[im 1/95]
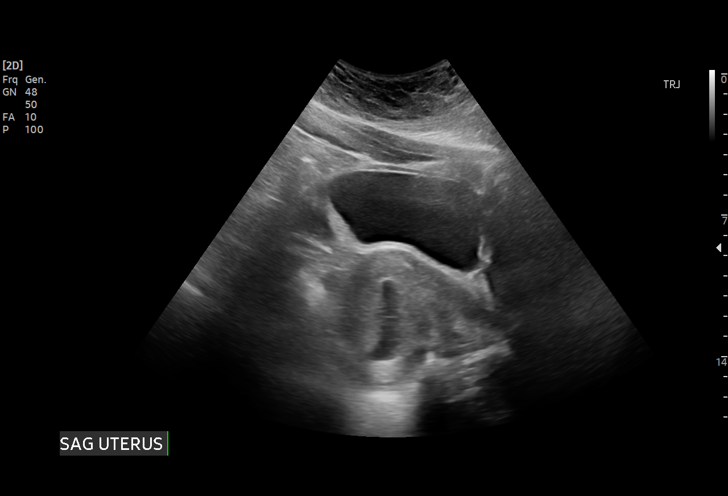
[im 7/95]
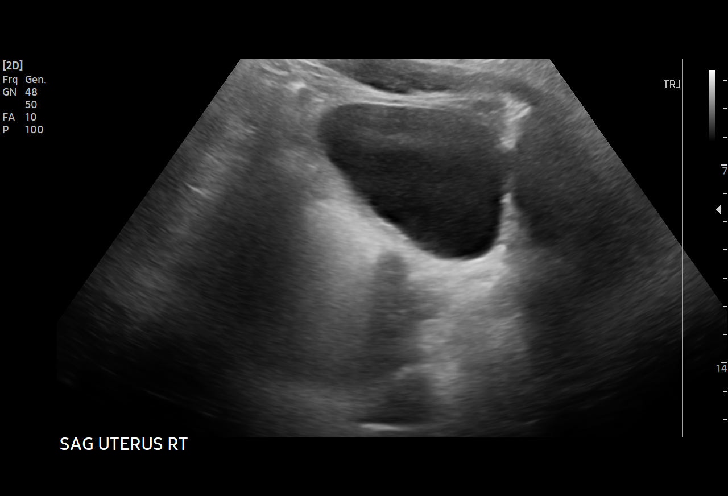
[im 14/95]
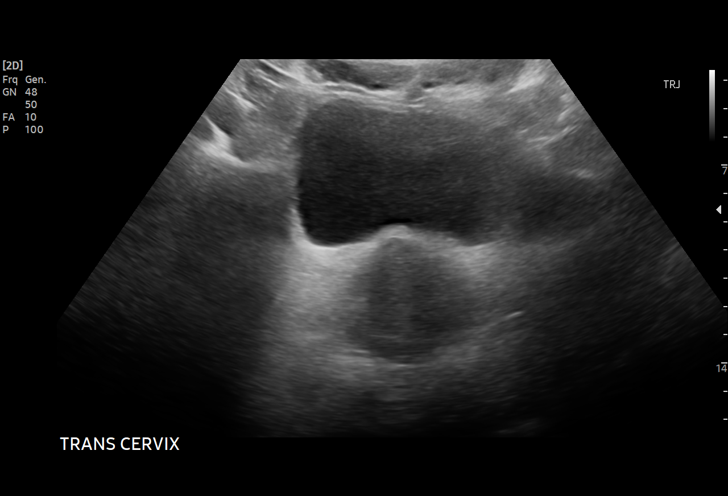
[im 21/95]
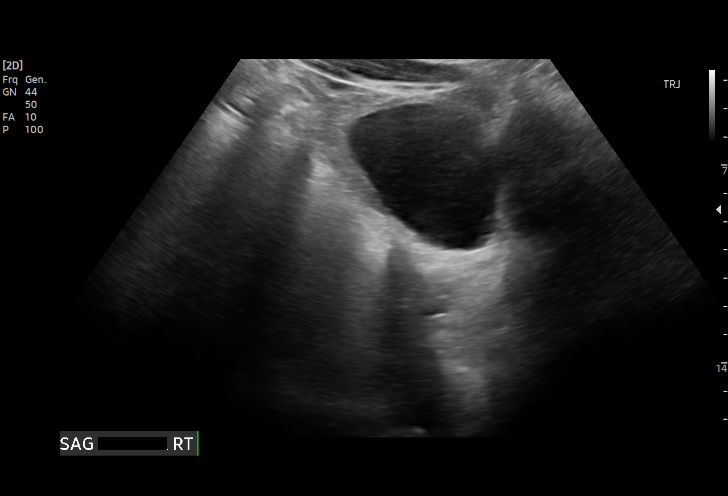
[im 28/95]
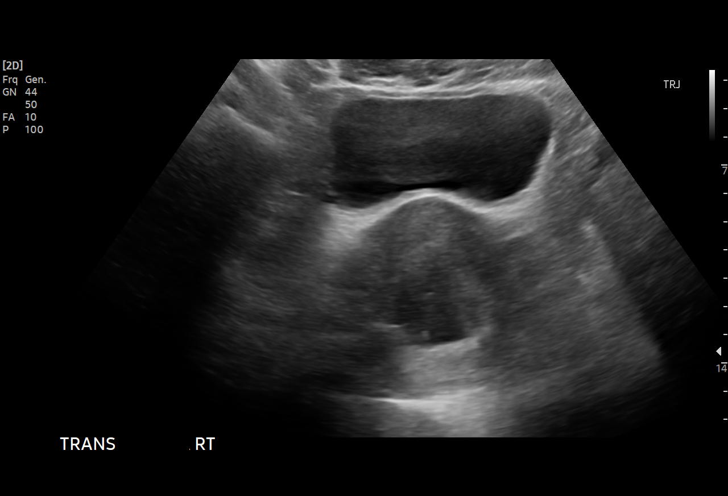
[im 35/95]
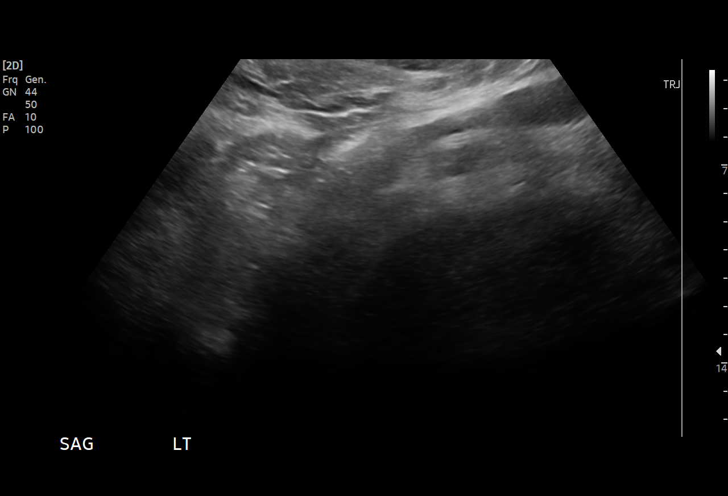
[im 42/95]
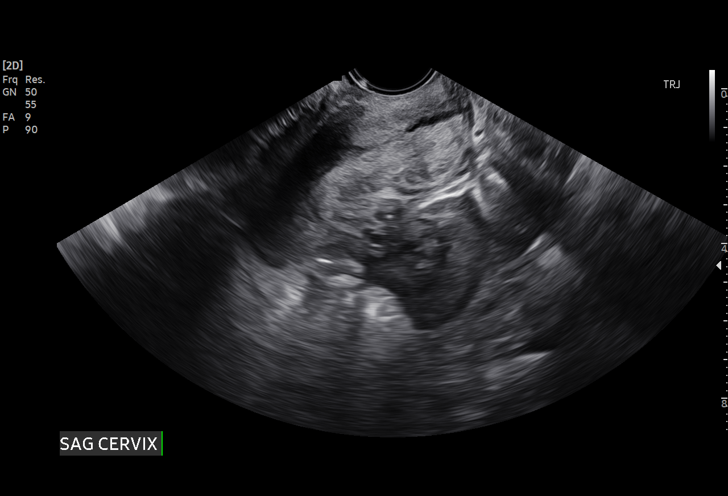
[im 49/95]
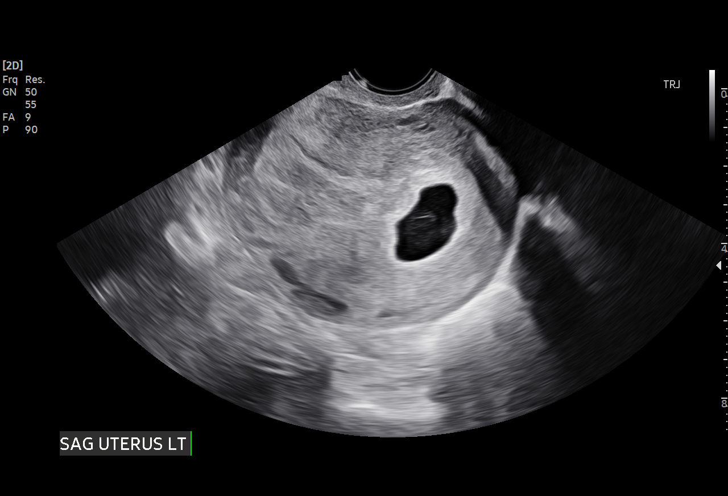
[im 53/95]
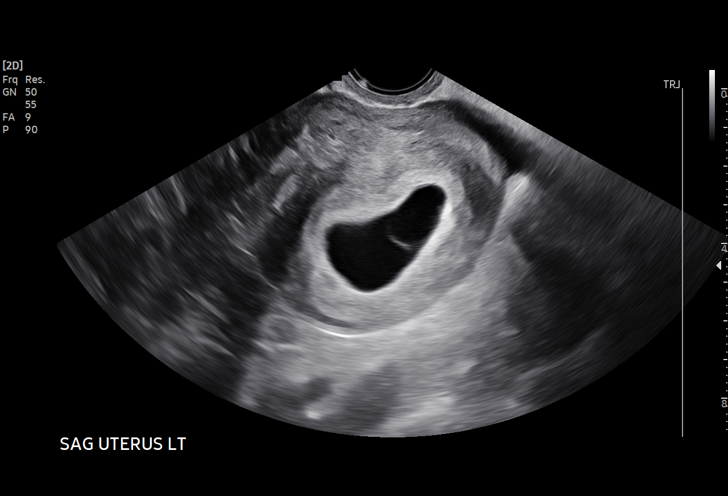
[im 60/95]
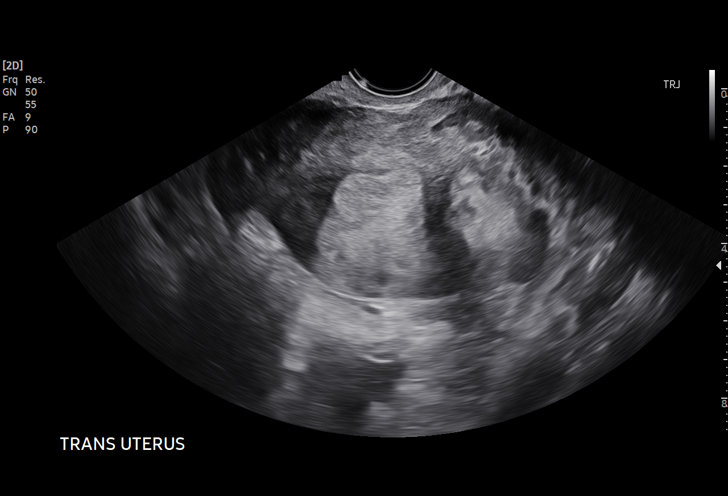
[im 67/95]
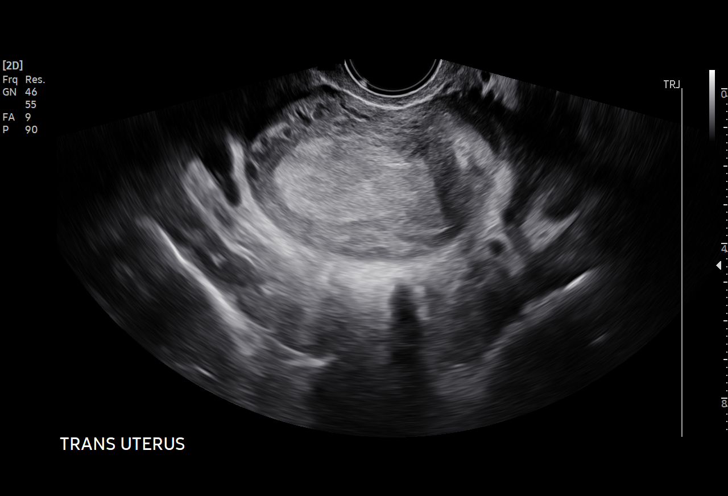
[im 74/95]
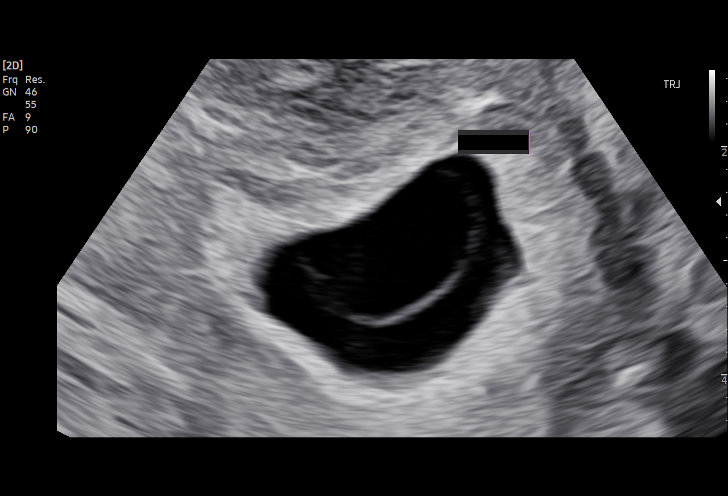
[im 81/95]
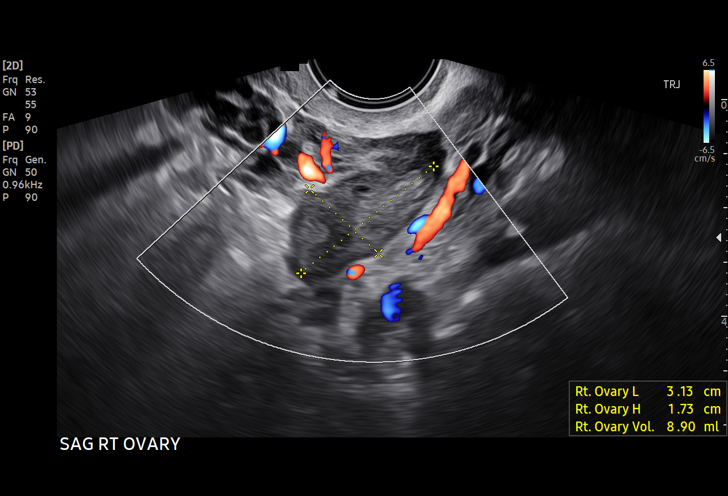
[im 88/95]
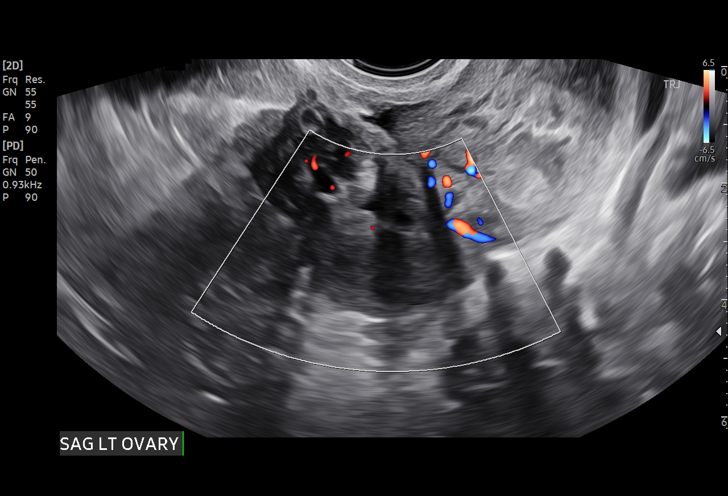
[im 95/95]
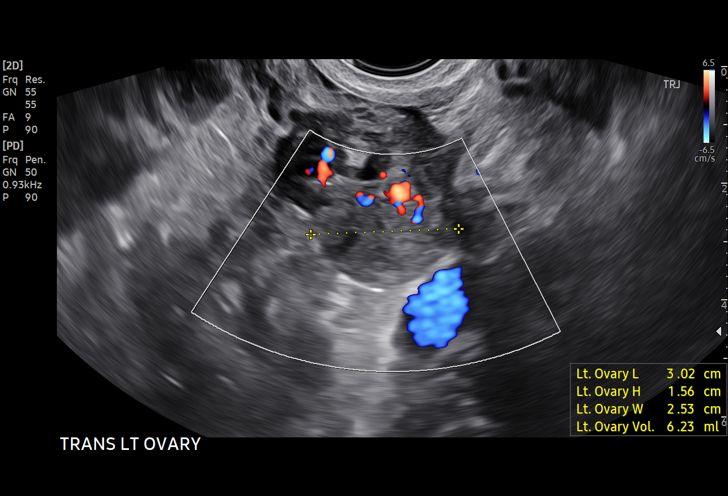

[15 of 28 positions shown; findings below may reference images not displayed]

FINDINGS: Intrauterine gestational sac: Single, irregular in shape

Yolk sac:  Visualized

Embryo:  Visualized.

Cardiac Activity: Not Visualized.

Heart Rate:   bpm

MSD: 32 mm   8 w   3 d

CRL:  5.4 mm   6 w   2 d                  US EDC: 04/06/2020

Subchorionic hemorrhage:  None visualized.

Maternal uterus/adnexae: Retroverted uterus. Posterior fibroid
measures 1.3 cm.
IMPRESSION: Discrepancy in mean sac diameter and crown-rump length. Estimated
gestational age by crown rump length 6 weeks 2 days. No fetal heart
tones detected. Findings are suspicious but not yet definitive for
failed pregnancy. Recommend follow-up US in 10-14 days for
definitive diagnosis. This recommendation follows SRU consensus
guidelines: Diagnostic Criteria for Nonviable Pregnancy Early in the
First Trimester. N Engl J Med 8972; [DATE].

## 2022-04-22 DIAGNOSIS — E1165 Type 2 diabetes mellitus with hyperglycemia: Secondary | ICD-10-CM | POA: Diagnosis not present

## 2022-04-22 DIAGNOSIS — I10 Essential (primary) hypertension: Secondary | ICD-10-CM | POA: Diagnosis not present

## 2022-07-05 DIAGNOSIS — J029 Acute pharyngitis, unspecified: Secondary | ICD-10-CM | POA: Diagnosis not present

## 2022-07-05 DIAGNOSIS — R051 Acute cough: Secondary | ICD-10-CM | POA: Diagnosis not present

## 2022-07-05 DIAGNOSIS — U071 COVID-19: Secondary | ICD-10-CM | POA: Diagnosis not present

## 2022-07-05 DIAGNOSIS — R5383 Other fatigue: Secondary | ICD-10-CM | POA: Diagnosis not present

## 2022-07-26 DIAGNOSIS — I10 Essential (primary) hypertension: Secondary | ICD-10-CM | POA: Diagnosis not present

## 2022-07-26 DIAGNOSIS — E7800 Pure hypercholesterolemia, unspecified: Secondary | ICD-10-CM | POA: Diagnosis not present

## 2022-07-26 DIAGNOSIS — E1165 Type 2 diabetes mellitus with hyperglycemia: Secondary | ICD-10-CM | POA: Diagnosis not present

## 2022-08-26 DIAGNOSIS — F419 Anxiety disorder, unspecified: Secondary | ICD-10-CM | POA: Diagnosis not present

## 2022-09-15 DIAGNOSIS — F419 Anxiety disorder, unspecified: Secondary | ICD-10-CM | POA: Diagnosis not present

## 2022-10-05 DIAGNOSIS — F419 Anxiety disorder, unspecified: Secondary | ICD-10-CM | POA: Diagnosis not present

## 2022-10-10 DIAGNOSIS — R519 Headache, unspecified: Secondary | ICD-10-CM | POA: Diagnosis not present

## 2022-10-10 DIAGNOSIS — J101 Influenza due to other identified influenza virus with other respiratory manifestations: Secondary | ICD-10-CM | POA: Diagnosis not present

## 2022-10-10 DIAGNOSIS — R509 Fever, unspecified: Secondary | ICD-10-CM | POA: Diagnosis not present

## 2022-10-10 DIAGNOSIS — R5381 Other malaise: Secondary | ICD-10-CM | POA: Diagnosis not present
